# Patient Record
Sex: Male | Born: 1965 | Race: White | Hispanic: No | Marital: Married | Smoking: Never smoker
Health system: Southern US, Community
[De-identification: ages and names within clinical notes are randomized; demographics above are authoritative.]

## PROBLEM LIST (undated history)

## (undated) DIAGNOSIS — T7840XA Allergy, unspecified, initial encounter: Secondary | ICD-10-CM

## (undated) DIAGNOSIS — K4090 Unilateral inguinal hernia, without obstruction or gangrene, not specified as recurrent: Secondary | ICD-10-CM

## (undated) DIAGNOSIS — E785 Hyperlipidemia, unspecified: Secondary | ICD-10-CM

## (undated) DIAGNOSIS — I10 Essential (primary) hypertension: Secondary | ICD-10-CM

## (undated) DIAGNOSIS — J45909 Unspecified asthma, uncomplicated: Secondary | ICD-10-CM

## (undated) HISTORY — DX: Unspecified asthma, uncomplicated: J45.909

## (undated) HISTORY — PX: HERNIA REPAIR: SHX51

## (undated) HISTORY — DX: Hyperlipidemia, unspecified: E78.5

## (undated) HISTORY — DX: Unilateral inguinal hernia, without obstruction or gangrene, not specified as recurrent: K40.90

## (undated) HISTORY — DX: Allergy, unspecified, initial encounter: T78.40XA

---

## 2017-06-06 LAB — HM COLONOSCOPY

## 2017-07-25 LAB — HM COLONOSCOPY

## 2020-03-02 ENCOUNTER — Ambulatory Visit: Payer: Self-pay | Attending: Internal Medicine

## 2020-03-02 DIAGNOSIS — Z23 Encounter for immunization: Secondary | ICD-10-CM

## 2020-03-02 NOTE — Progress Notes (Signed)
   Covid-19 Vaccination Clinic  Name:  Uvaldo Rybacki    MRN: [id number] DOB: 1966-09-30  03/02/2020  Mr. Lorenson was observed post Covid-19 immunization for 15 minutes without incident. He was provided with Vaccine Information Sheet and instruction to access the V-Safe system.   Mr. Roig was instructed to call 911 with any severe reactions post vaccine: Marland Kitchen Difficulty breathing  . Swelling of face and throat  . A fast heartbeat  . A bad rash all over body  . Dizziness and weakness   Immunizations Administered    Name Date Dose VIS Date Route   Pfizer COVID-19 Vaccine 03/02/2020  3:11 PM 0.3 mL 11/15/2019 Intramuscular   Manufacturer: [employer], [employer]   Lot: [id number]   NDC: [address]-5

## 2020-03-25 ENCOUNTER — Ambulatory Visit: Payer: Self-pay | Attending: Internal Medicine

## 2020-03-25 DIAGNOSIS — Z23 Encounter for immunization: Secondary | ICD-10-CM

## 2020-03-25 NOTE — Progress Notes (Signed)
   Covid-19 Vaccination Clinic  Name:  James Jimenez    MRN: [id number] DOB: 07/14/1966  03/25/2020  [name] was observed post Covid-19 immunization for 15 minutes without incident. He was provided with Vaccine Information Sheet and instruction to access the V-Safe system.   [name] was instructed to call 911 with any severe reactions post vaccine: Marland Kitchen Difficulty breathing  . Swelling of face and throat  . A fast heartbeat  . A bad rash all over body  . Dizziness and weakness   Immunizations Administered    Name Date Dose VIS Date Route   Pfizer COVID-19 Vaccine 03/25/2020 10:22 AM 0.3 mL 01/29/2019 Intramuscular   Manufacturer: [employer], [employer]   Lot: [id number]   NDC: [address]-7

## 2020-09-14 ENCOUNTER — Other Ambulatory Visit: Payer: Self-pay

## 2020-09-14 ENCOUNTER — Encounter: Payer: Self-pay | Admitting: Family Medicine

## 2020-09-14 ENCOUNTER — Ambulatory Visit (INDEPENDENT_AMBULATORY_CARE_PROVIDER_SITE_OTHER): Payer: 59 | Admitting: Family Medicine

## 2020-09-14 VITALS — BP 135/90 | HR 64 | Temp 98.2°F | Ht 73.0 in | Wt 239.0 lb

## 2020-09-14 DIAGNOSIS — I10 Essential (primary) hypertension: Secondary | ICD-10-CM

## 2020-09-14 DIAGNOSIS — Z0001 Encounter for general adult medical examination with abnormal findings: Secondary | ICD-10-CM

## 2020-09-14 DIAGNOSIS — K8050 Calculus of bile duct without cholangitis or cholecystitis without obstruction: Secondary | ICD-10-CM | POA: Diagnosis not present

## 2020-09-14 DIAGNOSIS — Z1211 Encounter for screening for malignant neoplasm of colon: Secondary | ICD-10-CM

## 2020-09-14 DIAGNOSIS — J45909 Unspecified asthma, uncomplicated: Secondary | ICD-10-CM | POA: Diagnosis not present

## 2020-09-14 DIAGNOSIS — Z13220 Encounter for screening for lipoid disorders: Secondary | ICD-10-CM

## 2020-09-14 DIAGNOSIS — Z125 Encounter for screening for malignant neoplasm of prostate: Secondary | ICD-10-CM

## 2020-09-14 LAB — COMPREHENSIVE METABOLIC PANEL
AG Ratio: 1.9 (calc) (ref 1.0–2.5)
ALT: 16 U/L (ref 9–46)
AST: 16 U/L (ref 10–35)
Albumin: 4.3 g/dL (ref 3.6–5.1)
Alkaline phosphatase (APISO): 67 U/L (ref 35–144)
BUN/Creatinine Ratio: 14 (calc) (ref 6–22)
BUN: 20 mg/dL (ref 7–25)
CO2: 31 mmol/L (ref 20–32)
Calcium: 9.5 mg/dL (ref 8.6–10.3)
Chloride: 106 mmol/L (ref 98–110)
Creat: 1.39 mg/dL — ABNORMAL HIGH (ref 0.70–1.33)
Globulin: 2.3 g/dL (calc) (ref 1.9–3.7)
Glucose, Bld: 78 mg/dL (ref 65–99)
Potassium: 4.5 mmol/L (ref 3.5–5.3)
Sodium: 142 mmol/L (ref 135–146)
Total Bilirubin: 0.5 mg/dL (ref 0.2–1.2)
Total Protein: 6.6 g/dL (ref 6.1–8.1)

## 2020-09-14 LAB — TSH: TSH: 1.31 mIU/L (ref 0.40–4.50)

## 2020-09-14 LAB — LIPID PANEL
Cholesterol: 169 mg/dL (ref <200)
HDL: 33 mg/dL — ABNORMAL LOW (ref >=40)
LDL Cholesterol (Calc): 100 mg/dL (calc) — ABNORMAL HIGH
Non-HDL Cholesterol (Calc): 136 mg/dL (calc) — ABNORMAL HIGH (ref <130)
Total CHOL/HDL Ratio: 5.1 (calc) — ABNORMAL HIGH (ref <5.0)
Triglycerides: 249 mg/dL — ABNORMAL HIGH (ref <150)

## 2020-09-14 LAB — CBC
HCT: 43.6 % (ref 38.5–50.0)
Hemoglobin: 14.6 g/dL (ref 13.2–17.1)
MCH: 30 pg (ref 27.0–33.0)
MCHC: 33.5 g/dL (ref 32.0–36.0)
MCV: 89.7 fL (ref 80.0–100.0)
MPV: 10.3 fL (ref 7.5–12.5)
Platelets: 214 10*3/uL (ref 140–400)
RBC: 4.86 10*6/uL (ref 4.20–5.80)
RDW: 12.6 % (ref 11.0–15.0)
WBC: 6.7 10*3/uL (ref 3.8–10.8)

## 2020-09-14 LAB — PSA: PSA: 2.88 ng/mL (ref <=4.0)

## 2020-09-14 MED ORDER — ALBUTEROL SULFATE HFA 108 (90 BASE) MCG/ACT IN AERS: 2.0000 | INHALATION_SPRAY | Freq: Four times a day (QID) | RESPIRATORY_TRACT | 0 refills | Status: DC | PRN

## 2020-09-14 MED ORDER — AMLODIPINE BESYLATE 5 MG PO TABS: 5.0000 mg | ORAL_TABLET | Freq: Every day | ORAL | 3 refills | Status: DC

## 2020-09-14 NOTE — Assessment & Plan Note (Signed)
Stable.  Albuterol refilled. ?

## 2020-09-14 NOTE — Patient Instructions (Signed)
It was very nice to see you today!  We will check blood work today.  I will place an order for you to get an ultrasound of your gallbladder.  We may need to refer you to see a surgeon pending on the results.  I will refill your amlodipine and albuterol.  I will see you back in a year.  Please come back to see me sooner if needed.  Take care, Dr Jerline Pain  Please try these tips to maintain a healthy lifestyle:   Eat at least 3 REAL meals and 1-2 snacks per day.  Aim for no more than 5 hours between eating.  If you eat breakfast, please do so within one hour of getting up.    Each meal should contain half fruits/vegetables, one quarter protein, and one quarter carbs (no bigger than a computer mouse)   Cut down on sweet beverages. This includes juice, soda, and sweet tea.     Drink at least 1 glass of water with each meal and aim for at least 8 glasses per day   Exercise at least 150 minutes every week.    Preventive Care 18-84 Years Old, Male Preventive care refers to lifestyle choices and visits with your health care provider that can promote health and wellness. This includes:  A yearly physical exam. This is also called an annual well check.  Regular dental and eye exams.  Immunizations.  Screening for certain conditions.  Healthy lifestyle choices, such as eating a healthy diet, getting regular exercise, not using drugs or products that contain nicotine and tobacco, and limiting alcohol use. What can I expect for my preventive care visit? Physical exam Your health care provider will check:  Height and weight. These may be used to calculate body mass index (BMI), which is a measurement that tells if you are at a healthy weight.  Heart rate and blood pressure.  Your skin for abnormal spots. Counseling Your health care provider may ask you questions about:  Alcohol, tobacco, and drug use.  Emotional well-being.  Home and relationship well-being.  Sexual  activity.  Eating habits.  Work and work Statistician. What immunizations do I need?  Influenza (flu) vaccine  This is recommended every year. Tetanus, diphtheria, and pertussis (Tdap) vaccine  You may need a Td booster every 10 years. Varicella (chickenpox) vaccine  You may need this vaccine if you have not already been vaccinated. Zoster (shingles) vaccine  You may need this after age 53. Measles, mumps, and rubella (MMR) vaccine  You may need at least one dose of MMR if you were born in 1957 or later. You may also need a second dose. Pneumococcal conjugate (PCV13) vaccine  You may need this if you have certain conditions and were not previously vaccinated. Pneumococcal polysaccharide (PPSV23) vaccine  You may need one or two doses if you smoke cigarettes or if you have certain conditions. Meningococcal conjugate (MenACWY) vaccine  You may need this if you have certain conditions. Hepatitis A vaccine  You may need this if you have certain conditions or if you travel or work in places where you may be exposed to hepatitis A. Hepatitis B vaccine  You may need this if you have certain conditions or if you travel or work in places where you may be exposed to hepatitis B. Haemophilus influenzae type b (Hib) vaccine  You may need this if you have certain risk factors. Human papillomavirus (HPV) vaccine  If recommended by your health care provider, you may  need three doses over 6 months. You may receive vaccines as individual doses or as more than one vaccine together in one shot (combination vaccines). Talk with your health care provider about the risks and benefits of combination vaccines. What tests do I need? Blood tests  Lipid and cholesterol levels. These may be checked every 5 years, or more frequently if you are over 52 years old.  Hepatitis C test.  Hepatitis B test. Screening  Lung cancer screening. You may have this screening every year starting at age 44 if  you have a 30-pack-year history of smoking and currently smoke or have quit within the past 15 years.  Prostate cancer screening. Recommendations will vary depending on your family history and other risks.  Colorectal cancer screening. All adults should have this screening starting at age 51 and continuing until age 92. Your health care provider may recommend screening at age 12 if you are at increased risk. You will have tests every 1-10 years, depending on your results and the type of screening test.  Diabetes screening. This is done by checking your blood sugar (glucose) after you have not eaten for a while (fasting). You may have this done every 1-3 years.  Sexually transmitted disease (STD) testing. Follow these instructions at home: Eating and drinking  Eat a diet that includes fresh fruits and vegetables, whole grains, lean protein, and low-fat dairy products.  Take vitamin and mineral supplements as recommended by your health care provider.  Do not drink alcohol if your health care provider tells you not to drink.  If you drink alcohol: ? Limit how much you have to 0-2 drinks a day. ? Be aware of how much alcohol is in your drink. In the [place] one drink equals one 12 oz bottle of beer (355 mL), one 5 oz glass of wine (148 mL), or one 1 oz glass of hard liquor (44 mL). Lifestyle  Take daily care of your teeth and gums.  Stay active. Exercise for at least 30 minutes on 5 or more days each week.  Do not use any products that contain nicotine or tobacco, such as cigarettes, e-cigarettes, and chewing tobacco. If you need help quitting, ask your health care provider.  If you are sexually active, practice safe sex. Use a condom or other form of protection to prevent STIs (sexually transmitted infections).  Talk with your health care provider about taking a low-dose aspirin every day starting at age 73. What's next?  Go to your health care provider once a year for a well check  visit.  Ask your health care provider how often you should have your eyes and teeth checked.  Stay up to date on all vaccines. This information is not intended to replace advice given to you by your health care provider. Make sure you discuss any questions you have with your health care provider. Document Revised: 11/15/2018 Document Reviewed: 11/15/2018 Elsevier Patient Education  2020 [employer].

## 2020-09-14 NOTE — Progress Notes (Addendum)
Chief Complaint:  James Jimenez is a 54 y.o. male who presents today for his annual comprehensive physical exam.    Assessment/Plan:  New/Acute Problems: Biliary colic We will check right upper quadrant ultrasound. May need surgical referral depending on results.   Chronic Problems Addressed Today: Reactive airway disease without complication Stable.  Albuterol refilled.  Essential hypertension At goal.  Refill of amlodipine 5 mg daily.  Preventative Healthcare: Referral placed for colonoscopy.  Check CBC, CMET, TSH, and lipid panel.  Check PSA.  Has received Covid vaccine.  Patient Counseling(The following topics were reviewed and/or handout was given):  -Nutrition: Stressed importance of moderation in sodium/caffeine intake, saturated fat and cholesterol, caloric balance, sufficient intake of fresh fruits, vegetables, and fiber.  -Stressed the importance of regular exercise.   -Substance Abuse: Discussed cessation/primary prevention of tobacco, alcohol, or other drug use; driving or other dangerous activities under the influence; availability of treatment for abuse.   -Injury prevention: Discussed safety belts, safety helmets, smoke detector, smoking near bedding or upholstery.   -Sexuality: Discussed sexually transmitted diseases, partner selection, use of condoms, avoidance of unintended pregnancy and contraceptive alternatives.   -Dental health: Discussed importance of regular tooth brushing, flossing, and dental visits.  -Health maintenance and immunizations reviewed. Please refer to Health maintenance section.  Return to care in 1 year for next preventative visit.     Subjective:  HPI:  He has no acute complaints today.   He would like a refill on amlodipine and albuterol.  He has been taking amlodipine 5 mg daily and doing well.  He uses albuterol very frequently as needed.  Is also concerned about gallstones.  Has had 2 episodes of right upper quadrant and right  lower back pain after eating something especially fatty such as ice cream.  Some associated nausea as well.  Has stable history of gallstones.  Lifestyle Diet: Balanced. Plenty of fruits and vegetables.  Exercise: Busy with work.   No flowsheet data found.  Health Maintenance Due  Topic Date Due  . Hepatitis C Screening  Never done  . HIV Screening  Never done  . TETANUS/TDAP  Never done  . COLONOSCOPY  12/06/2019     ROS: Per HPI, otherwise a complete review of systems was negative.   PMH:  The following were reviewed and entered/updated in epic: Past Medical History:  Diagnosis Date  . Hyperlipidemia    Patient Active Problem List   Diagnosis Date Noted  . Essential hypertension 09/14/2020  . Reactive airway disease without complication 09/14/2020   Past Surgical History:  Procedure Laterality Date  . HERNIA REPAIR  1995 1991    Family History  Problem Relation Age of Onset  . Hypertension Mother   . Arthritis Mother   . Cancer Mother   . Hyperlipidemia Father   . Cancer Father   . Hypertension Sister   . Gallbladder disease Sister   . Gallbladder disease Sister   . Arthritis Maternal Grandmother   . Hypertension Maternal Grandmother   . Kidney disease Maternal Grandmother   . Cancer Maternal Grandfather   . Epilepsy Maternal Grandfather   . Neuropathy Paternal Grandfather     Medications- reviewed and updated Current Outpatient Medications  Medication Sig Dispense Refill  . albuterol (VENTOLIN HFA) 108 (90 Base) MCG/ACT inhaler Inhale 2 puffs into the lungs every 6 (six) hours as needed for wheezing or shortness of breath. 8 g 0  . amLODipine (NORVASC) 5 MG tablet Take 1 tablet (5 mg total)  by mouth daily. 90 tablet 3   No current facility-administered medications for this visit.    Allergies-reviewed and updated No Known Allergies  Social History   Socioeconomic History  . Marital status: Married    Spouse name: Not on file  . Number of  children: Not on file  . Years of education: Not on file  . Highest education level: Not on file  Occupational History  . Not on file  Tobacco Use  . Smoking status: Not on file  Substance and Sexual Activity  . Alcohol use: Never  . Drug use: Never  . Sexual activity: Not on file  Other Topics Concern  . Not on file  Social History Narrative  . Not on file   Social Determinants of Health   Financial Resource Strain:   . Difficulty of Paying Living Expenses: Not on file  Food Insecurity:   . Worried About Programme researcher, broadcasting/film/video in the Last Year: Not on file  . Ran Out of Food in the Last Year: Not on file  Transportation Needs:   . Lack of Transportation (Medical): Not on file  . Lack of Transportation (Non-Medical): Not on file  Physical Activity:   . Days of Exercise per Week: Not on file  . Minutes of Exercise per Session: Not on file  Stress:   . Feeling of Stress : Not on file  Social Connections:   . Frequency of Communication with Friends and Family: Not on file  . Frequency of Social Gatherings with Friends and Family: Not on file  . Attends Religious Services: Not on file  . Active Member of Clubs or Organizations: Not on file  . Attends Banker Meetings: Not on file  . Marital Status: Not on file        Objective:  Physical Exam: BP 135/90   Pulse 64   Temp 98.2 F (36.8 C) (Temporal)   Ht 6\' 1"  (1.854 m)   Wt 239 lb (108.4 kg)   SpO2 97%   BMI 31.53 kg/m   Body mass index is 31.53 kg/m. Wt Readings from Last 3 Encounters:  09/14/20 239 lb (108.4 kg)   Gen: NAD, resting comfortably HEENT: TMs normal bilaterally. OP clear. No thyromegaly noted.  CV: RRR with no murmurs appreciated Pulm: NWOB, CTAB with no crackles, wheezes, or rhonchi GI: Normal bowel sounds present. Soft, Nontender, Nondistended. MSK: no edema, cyanosis, or clubbing noted Skin: warm, dry Neuro: CN2-12 grossly intact. Strength 5/5 in upper and lower extremities.  Reflexes symmetric and intact bilaterally.  Psych: Normal affect and thought content     Peter Daquila M. 11/14/20, MD 09/14/2020 2:42 PM

## 2020-09-14 NOTE — Assessment & Plan Note (Signed)
At goal.  Refill of amlodipine 5 mg daily.

## 2020-09-16 NOTE — Progress Notes (Signed)
Please inform patient of the following:  Cholesterol is borderline. Everything else is stable. We can recheck in a year. Will contact him with results of ultrasound once we receive them.  James Jimenez. [name], MD 09/16/2020 9:40 AM

## 2020-09-23 ENCOUNTER — Ambulatory Visit
Admission: RE | Admit: 2020-09-23 | Discharge: 2020-09-23 | Disposition: A | Discharge status: Home / Self Care | Payer: 59 | Source: Ambulatory Visit | Attending: Family Medicine | Admitting: Family Medicine

## 2020-09-23 DIAGNOSIS — K8050 Calculus of bile duct without cholangitis or cholecystitis without obstruction: Secondary | ICD-10-CM

## 2020-09-24 ENCOUNTER — Other Ambulatory Visit: Payer: Self-pay

## 2020-09-24 DIAGNOSIS — K8050 Calculus of bile duct without cholangitis or cholecystitis without obstruction: Secondary | ICD-10-CM

## 2020-09-24 DIAGNOSIS — K8020 Calculus of gallbladder without cholecystitis without obstruction: Secondary | ICD-10-CM

## 2020-09-24 NOTE — Progress Notes (Signed)
Please inform patient of the following:  Ultrasound confirms gallstones. Recommend surgical referral to discuss next steps.  [name]. [name], MD 09/24/2020 10:57 AM

## 2020-10-15 ENCOUNTER — Encounter: Payer: Self-pay | Admitting: *Deleted

## 2020-11-04 ENCOUNTER — Ambulatory Visit: Payer: Self-pay | Admitting: Surgery

## 2020-11-23 ENCOUNTER — Other Ambulatory Visit (HOSPITAL_COMMUNITY): Payer: 59

## 2020-11-23 ENCOUNTER — Encounter (HOSPITAL_COMMUNITY): Admission: RE | Admit: 2020-11-23 | Payer: 59 | Source: Ambulatory Visit

## 2020-12-03 NOTE — Progress Notes (Signed)
DUE TO COVID-19 ONLY ONE VISITOR IS ALLOWED TO COME WITH YOU AND STAY IN THE WAITING ROOM ONLY DURING PRE OP AND PROCEDURE DAY OF SURGERY. THE 1 VISITOR  MAY VISIT WITH YOU AFTER SURGERY IN YOUR PRIVATE ROOM DURING VISITING HOURS ONLY!  YOU NEED TO HAVE A COVID 19 TEST ON__1/02/2021 _____ @_0930am  ______, THIS TEST MUST BE DONE BEFORE SURGERY,  COVID TESTING SITE [address] [place] , IT IS ON THE RIGHT GOING OUT [address] APPROXIMATELY  2 MINUTES PAST [employer] ON THE RIGHT. ONCE YOUR COVID TEST IS COMPLETED,  PLEASE BEGIN THE QUARANTINE INSTRUCTIONS AS OUTLINED IN YOUR HANDOUT.                James Jimenez  12/03/2020   Your procedure is scheduled on: 12/09/2020    Report to [facility] Main  Entrance   Report to admitting at    0900 AM     Call this number if you have problems the morning of surgery [phone]    REMEMBER: NO  SOLID FOOD CANDY OR GUM AFTER MIDNIGHT. CLEAR LIQUIDS UNTIL 0800am         . NOTHING BY MOUTH EXCEPT CLEAR LIQUIDS UNTIL    . PLEASE FINISH ENSURE DRINK PER SURGEON ORDER  WHICH NEEDS TO BE COMPLETED AT    0800am   .      CLEAR LIQUID DIET   Foods Allowed                                                                    Coffee and tea, regular and decaf                            Fruit ices (not with fruit pulp)                                      Iced Popsicles                                    Carbonated beverages, regular and diet                                    Cranberry, grape and apple juices Sports drinks like Gatorade Lightly seasoned clear broth or consume(fat free) Sugar, honey syrup ___________________________________________________________________      BRUSH YOUR TEETH MORNING OF SURGERY AND RINSE YOUR MOUTH OUT, NO CHEWING GUM CANDY OR MINTS.     Take these medicines the morning of surgery with A SIP OF WATER: amlodipine, inhalers as usual and bring,   DO NOT TAKE ANY DIABETIC  MEDICATIONS DAY OF YOUR SURGERY                               You may not have any metal on your body including hair pins and  piercings  Do not wear jewelry, make-up, lotions, powders or perfumes, deodorant             Do not wear nail polish on your fingernails.  Do not shave  48 hours prior to surgery.              Men may shave face and neck.   Do not bring valuables to the hospital. [place].  Contacts, dentures or bridgework may not be worn into surgery.  Leave suitcase in the car. After surgery it may be brought to your room.     Patients discharged the day of surgery will not be allowed to drive home. IF YOU ARE HAVING SURGERY AND GOING HOME THE SAME DAY, YOU MUST HAVE AN ADULT TO DRIVE YOU HOME AND BE WITH YOU FOR 24 HOURS. YOU MAY GO HOME BY TAXI OR UBER OR ORTHERWISE, BUT AN ADULT MUST ACCOMPANY YOU HOME AND STAY WITH YOU FOR 24 HOURS.  Name and phone number of your driver:  Special Instructions: N/A              Please read over the following fact sheets you were given: _____________________________________________________________________  [facility] - Preparing for Surgery Before surgery, you can play an important role.  Because skin is not sterile, your skin needs to be as free of germs as possible.  You can reduce the number of germs on your skin by washing with CHG (chlorahexidine gluconate) soap before surgery.  CHG is an antiseptic cleaner which kills germs and bonds with the skin to continue killing germs even after washing. Please DO NOT use if you have an allergy to CHG or antibacterial soaps.  If your skin becomes reddened/irritated stop using the CHG and inform your nurse when you arrive at Short Stay. Do not shave (including legs and underarms) for at least 48 hours prior to the first CHG shower.  You may shave your face/neck. Please follow these instructions carefully:  1.  Shower with CHG Soap the night  before surgery and the  morning of Surgery.  2.  If you choose to wash your hair, wash your hair first as usual with your  normal  shampoo.  3.  After you shampoo, rinse your hair and body thoroughly to remove the  shampoo.                           4.  Use CHG as you would any other liquid soap.  You can apply chg directly  to the skin and wash                       Gently with a scrungie or clean washcloth.  5.  Apply the CHG Soap to your body ONLY FROM THE NECK DOWN.   Do not use on face/ open                           Wound or open sores. Avoid contact with eyes, ears mouth and genitals (private parts).                       Wash face,  Genitals (private parts) with your normal soap.             6.  Wash thoroughly, paying special attention to the area where your surgery  will be performed.  7.  Thoroughly rinse your body with warm water from the neck down.  8.  DO NOT shower/wash with your normal soap after using and rinsing off  the CHG Soap.                9.  Pat yourself dry with a clean towel.            10.  Wear clean pajamas.            11.  Place clean sheets on your bed the night of your first shower and do not  sleep with pets. Day of Surgery : Do not apply any lotions/deodorants the morning of surgery.  Please wear clean clothes to the hospital/surgery center.  FAILURE TO FOLLOW THESE INSTRUCTIONS MAY RESULT IN THE CANCELLATION OF YOUR SURGERY PATIENT SIGNATURE_________________________________  NURSE SIGNATURE__________________________________  ________________________________________________________________________

## 2020-12-07 ENCOUNTER — Other Ambulatory Visit (HOSPITAL_COMMUNITY)
Admission: RE | Admit: 2020-12-07 | Discharge: 2020-12-07 | Disposition: A | Discharge status: Home / Self Care | Payer: 59 | Source: Ambulatory Visit | Attending: Surgery | Admitting: Surgery

## 2020-12-07 ENCOUNTER — Other Ambulatory Visit: Payer: Self-pay

## 2020-12-07 ENCOUNTER — Encounter (HOSPITAL_COMMUNITY): Payer: Self-pay

## 2020-12-07 ENCOUNTER — Encounter (HOSPITAL_COMMUNITY)
Admission: RE | Admit: 2020-12-07 | Discharge: 2020-12-07 | Disposition: A | Discharge status: Home / Self Care | Payer: 59 | Source: Ambulatory Visit | Attending: Surgery | Admitting: Surgery

## 2020-12-07 DIAGNOSIS — Z01812 Encounter for preprocedural laboratory examination: Secondary | ICD-10-CM | POA: Insufficient documentation

## 2020-12-07 DIAGNOSIS — Z20822 Contact with and (suspected) exposure to covid-19: Secondary | ICD-10-CM | POA: Diagnosis not present

## 2020-12-07 DIAGNOSIS — Z01818 Encounter for other preprocedural examination: Secondary | ICD-10-CM | POA: Insufficient documentation

## 2020-12-07 HISTORY — DX: Essential (primary) hypertension: I10

## 2020-12-07 LAB — BASIC METABOLIC PANEL
Anion gap: 8 (ref 5–15)
BUN: 18 mg/dL (ref 6–20)
CO2: 26 mmol/L (ref 22–32)
Calcium: 9.2 mg/dL (ref 8.9–10.3)
Chloride: 107 mmol/L (ref 98–111)
Creatinine, Ser: 1.15 mg/dL (ref 0.61–1.24)
GFR, Estimated: >60 mL/min (ref >60)
Glucose, Bld: 118 mg/dL — ABNORMAL HIGH (ref 70–99)
Potassium: 3.7 mmol/L (ref 3.5–5.1)
Sodium: 141 mmol/L (ref 135–145)

## 2020-12-07 LAB — CBC
HCT: 45.6 % (ref 39.0–52.0)
Hemoglobin: 15.2 g/dL (ref 13.0–17.0)
MCH: 30.2 pg (ref 26.0–34.0)
MCHC: 33.3 g/dL (ref 30.0–36.0)
MCV: 90.5 fL (ref 80.0–100.0)
Platelets: 209 10*3/uL (ref 150–400)
RBC: 5.04 MIL/uL (ref 4.22–5.81)
RDW: 12.6 % (ref 11.5–15.5)
WBC: 5.4 10*3/uL (ref 4.0–10.5)
nRBC: 0 % (ref 0.0–0.2)

## 2020-12-08 LAB — SARS CORONAVIRUS 2 (TAT 6-24 HRS): SARS Coronavirus 2: NEGATIVE

## 2020-12-09 ENCOUNTER — Ambulatory Visit (HOSPITAL_COMMUNITY)
Admission: RE | Admit: 2020-12-09 | Discharge: 2020-12-09 | Disposition: A | Discharge status: Home / Self Care | Payer: 59 | Attending: Surgery | Admitting: Surgery

## 2020-12-09 ENCOUNTER — Encounter (HOSPITAL_COMMUNITY): Payer: Self-pay | Admitting: Surgery

## 2020-12-09 ENCOUNTER — Ambulatory Visit: Payer: Self-pay | Admitting: Surgery

## 2020-12-09 ENCOUNTER — Ambulatory Visit (HOSPITAL_COMMUNITY): Payer: 59 | Admitting: Certified Registered Nurse Anesthetist

## 2020-12-09 ENCOUNTER — Encounter (HOSPITAL_COMMUNITY)
Admission: RE | Disposition: A | Discharge status: Home / Self Care | Payer: Self-pay | Source: Home / Self Care | Attending: Surgery

## 2020-12-09 DIAGNOSIS — K8000 Calculus of gallbladder with acute cholecystitis without obstruction: Secondary | ICD-10-CM | POA: Insufficient documentation

## 2020-12-09 DIAGNOSIS — Z79899 Other long term (current) drug therapy: Secondary | ICD-10-CM | POA: Insufficient documentation

## 2020-12-09 DIAGNOSIS — K8050 Calculus of bile duct without cholangitis or cholecystitis without obstruction: Secondary | ICD-10-CM | POA: Diagnosis present

## 2020-12-09 HISTORY — PX: CHOLECYSTECTOMY: SHX55

## 2020-12-09 SURGERY — LAPAROSCOPIC CHOLECYSTECTOMY
Anesthesia: General | Site: Abdomen

## 2020-12-09 MED ORDER — ROCURONIUM BROMIDE 10 MG/ML (PF) SYRINGE
PREFILLED_SYRINGE | INTRAVENOUS | Status: DC | PRN
  Administered 2020-12-09: 60 mg via INTRAVENOUS

## 2020-12-09 MED ORDER — DEXAMETHASONE SODIUM PHOSPHATE 10 MG/ML IJ SOLN
INTRAMUSCULAR | Status: AC
  Filled 2020-12-09: qty 1

## 2020-12-09 MED ORDER — MIDAZOLAM HCL 5 MG/5ML IJ SOLN
INTRAMUSCULAR | Status: DC | PRN
  Administered 2020-12-09: 2 mg via INTRAVENOUS

## 2020-12-09 MED ORDER — ROCURONIUM BROMIDE 10 MG/ML (PF) SYRINGE
PREFILLED_SYRINGE | INTRAVENOUS | Status: AC
  Filled 2020-12-09: qty 10

## 2020-12-09 MED ORDER — PROPOFOL 10 MG/ML IV BOLUS
INTRAVENOUS | Status: AC
  Filled 2020-12-09: qty 20

## 2020-12-09 MED ORDER — CHLORHEXIDINE GLUCONATE CLOTH 2 % EX PADS: 6.0000 | MEDICATED_PAD | Freq: Once | CUTANEOUS | Status: DC

## 2020-12-09 MED ORDER — ACETAMINOPHEN 500 MG PO TABS
1000.0000 mg | ORAL_TABLET | ORAL | Status: AC
  Administered 2020-12-09: 1000 mg via ORAL
  Filled 2020-12-09: qty 2

## 2020-12-09 MED ORDER — MIDAZOLAM HCL 2 MG/2ML IJ SOLN
INTRAMUSCULAR | Status: AC
  Filled 2020-12-09: qty 2

## 2020-12-09 MED ORDER — FENTANYL CITRATE (PF) 250 MCG/5ML IJ SOLN
INTRAMUSCULAR | Status: AC
  Filled 2020-12-09: qty 5

## 2020-12-09 MED ORDER — EPHEDRINE SULFATE 50 MG/ML IJ SOLN
INTRAMUSCULAR | Status: DC | PRN
  Administered 2020-12-09 (×2): 5 mg via INTRAVENOUS

## 2020-12-09 MED ORDER — ORAL CARE MOUTH RINSE: 15.0000 mL | Freq: Once | OROMUCOSAL | Status: AC

## 2020-12-09 MED ORDER — OXYCODONE HCL 5 MG PO TABS
ORAL_TABLET | ORAL | Status: AC
  Filled 2020-12-09: qty 1

## 2020-12-09 MED ORDER — PHENYLEPHRINE 40 MCG/ML (10ML) SYRINGE FOR IV PUSH (FOR BLOOD PRESSURE SUPPORT)
PREFILLED_SYRINGE | INTRAVENOUS | Status: AC
  Filled 2020-12-09: qty 10

## 2020-12-09 MED ORDER — SUGAMMADEX SODIUM 200 MG/2ML IV SOLN
INTRAVENOUS | Status: DC | PRN
  Administered 2020-12-09: 200 mg via INTRAVENOUS

## 2020-12-09 MED ORDER — ONDANSETRON HCL 4 MG/2ML IJ SOLN
INTRAMUSCULAR | Status: DC | PRN
  Administered 2020-12-09: 4 mg via INTRAVENOUS

## 2020-12-09 MED ORDER — FENTANYL CITRATE (PF) 250 MCG/5ML IJ SOLN
INTRAMUSCULAR | Status: DC | PRN
  Administered 2020-12-09 (×2): 50 ug via INTRAVENOUS
  Administered 2020-12-09: 100 ug via INTRAVENOUS

## 2020-12-09 MED ORDER — OXYCODONE HCL 5 MG/5ML PO SOLN: 5.0000 mg | Freq: Once | ORAL | Status: AC | PRN

## 2020-12-09 MED ORDER — GLYCOPYRROLATE PF 0.2 MG/ML IJ SOSY
PREFILLED_SYRINGE | INTRAMUSCULAR | Status: DC | PRN
  Administered 2020-12-09: .2 mg via INTRAVENOUS

## 2020-12-09 MED ORDER — CEFAZOLIN SODIUM-DEXTROSE 2-4 GM/100ML-% IV SOLN
2.0000 g | INTRAVENOUS | Status: AC
  Administered 2020-12-09: 2 g via INTRAVENOUS
  Filled 2020-12-09: qty 100

## 2020-12-09 MED ORDER — DEXAMETHASONE SODIUM PHOSPHATE 10 MG/ML IJ SOLN
INTRAMUSCULAR | Status: DC | PRN
  Administered 2020-12-09: 10 mg via INTRAVENOUS

## 2020-12-09 MED ORDER — LACTATED RINGERS IR SOLN
Status: DC | PRN
  Administered 2020-12-09: 1000 mL

## 2020-12-09 MED ORDER — ACETAMINOPHEN 500 MG PO TABS
500.0000 mg | ORAL_TABLET | Freq: Four times a day (QID) | ORAL | 0 refills | Status: DC | PRN
Start: 2020-12-09 — End: 2020-12-19

## 2020-12-09 MED ORDER — HYDROMORPHONE HCL 1 MG/ML IJ SOLN
INTRAMUSCULAR | Status: AC
  Filled 2020-12-09: qty 1

## 2020-12-09 MED ORDER — BUPIVACAINE-EPINEPHRINE (PF) 0.25% -1:200000 IJ SOLN
INTRAMUSCULAR | Status: AC
  Filled 2020-12-09: qty 30

## 2020-12-09 MED ORDER — PROPOFOL 10 MG/ML IV BOLUS
INTRAVENOUS | Status: DC | PRN
  Administered 2020-12-09: 150 mg via INTRAVENOUS
  Administered 2020-12-09: 50 mg via INTRAVENOUS

## 2020-12-09 MED ORDER — OXYCODONE-ACETAMINOPHEN 5-325 MG PO TABS: 1.0000 | ORAL_TABLET | ORAL | 0 refills | Status: DC | PRN

## 2020-12-09 MED ORDER — LIDOCAINE HCL (PF) 2 % IJ SOLN
INTRAMUSCULAR | Status: AC
  Filled 2020-12-09: qty 5

## 2020-12-09 MED ORDER — IBUPROFEN 800 MG PO TABS: 800.0000 mg | ORAL_TABLET | Freq: Three times a day (TID) | ORAL | 0 refills | Status: AC

## 2020-12-09 MED ORDER — KETOROLAC TROMETHAMINE 30 MG/ML IJ SOLN
30.0000 mg | Freq: Once | INTRAMUSCULAR | Status: AC | PRN
  Administered 2020-12-09: 30 mg via INTRAVENOUS

## 2020-12-09 MED ORDER — CELECOXIB 200 MG PO CAPS
400.0000 mg | ORAL_CAPSULE | ORAL | Status: AC
  Administered 2020-12-09: 400 mg via ORAL
  Filled 2020-12-09: qty 2

## 2020-12-09 MED ORDER — LACTATED RINGERS IV SOLN: INTRAVENOUS | Status: DC

## 2020-12-09 MED ORDER — LIDOCAINE 2% (20 MG/ML) 5 ML SYRINGE
INTRAMUSCULAR | Status: DC | PRN
  Administered 2020-12-09: 60 mg via INTRAVENOUS

## 2020-12-09 MED ORDER — BUPIVACAINE-EPINEPHRINE 0.25% -1:200000 IJ SOLN
INTRAMUSCULAR | Status: DC | PRN
  Administered 2020-12-09: 30 mL

## 2020-12-09 MED ORDER — HYDROMORPHONE HCL 1 MG/ML IJ SOLN
0.2500 mg | INTRAMUSCULAR | Status: DC | PRN
Start: 2020-12-09 — End: 2020-12-09
  Administered 2020-12-09: 0.25 mg via INTRAVENOUS

## 2020-12-09 MED ORDER — 0.9 % SODIUM CHLORIDE (POUR BTL) OPTIME
TOPICAL | Status: DC | PRN
  Administered 2020-12-09: 1000 mL

## 2020-12-09 MED ORDER — OXYCODONE HCL 5 MG PO TABS
5.0000 mg | ORAL_TABLET | Freq: Once | ORAL | Status: AC | PRN
  Administered 2020-12-09: 5 mg via ORAL

## 2020-12-09 MED ORDER — ONDANSETRON HCL 4 MG/2ML IJ SOLN
INTRAMUSCULAR | Status: AC
  Filled 2020-12-09: qty 2

## 2020-12-09 MED ORDER — GABAPENTIN 300 MG PO CAPS
300.0000 mg | ORAL_CAPSULE | ORAL | Status: AC
  Administered 2020-12-09: 300 mg via ORAL
  Filled 2020-12-09: qty 1

## 2020-12-09 MED ORDER — PROMETHAZINE HCL 25 MG/ML IJ SOLN
6.2500 mg | INTRAMUSCULAR | Status: DC | PRN
Start: 2020-12-09 — End: 2020-12-09

## 2020-12-09 MED ORDER — KETOROLAC TROMETHAMINE 30 MG/ML IJ SOLN
INTRAMUSCULAR | Status: AC
  Filled 2020-12-09: qty 1

## 2020-12-09 MED ORDER — CHLORHEXIDINE GLUCONATE 0.12 % MT SOLN
15.0000 mL | Freq: Once | OROMUCOSAL | Status: AC
  Administered 2020-12-09: 15 mL via OROMUCOSAL

## 2020-12-09 SURGICAL SUPPLY — 34 items
APPLIER CLIP ROT 10 11.4 M/L (STAPLE) ×2
CABLE HIGH FREQUENCY MONO STRZ (ELECTRODE) ×2 IMPLANT
CHLORAPREP W/TINT 26 (MISCELLANEOUS) ×2 IMPLANT
CLIP APPLIE ROT 10 11.4 M/L (STAPLE) ×1 IMPLANT
COVER MAYO STAND STRL (DRAPES) IMPLANT
COVER SURGICAL LIGHT HANDLE (MISCELLANEOUS) ×2 IMPLANT
COVER WAND RF STERILE (DRAPES) IMPLANT
DECANTER SPIKE VIAL GLASS SM (MISCELLANEOUS) ×2 IMPLANT
DERMABOND ADVANCED (GAUZE/BANDAGES/DRESSINGS) ×1
DERMABOND ADVANCED .7 DNX12 (GAUZE/BANDAGES/DRESSINGS) ×1 IMPLANT
DRAPE C-ARM 42X120 X-RAY (DRAPES) IMPLANT
ELECT REM PT RETURN 15FT ADLT (MISCELLANEOUS) ×2 IMPLANT
GLOVE SURG ENC MOIS LTX SZ6 (GLOVE) ×2 IMPLANT
GLOVE SURG UNDER LTX SZ6.5 (GLOVE) ×2 IMPLANT
GOWN STRL REUS W/TWL LRG LVL3 (GOWN DISPOSABLE) ×2 IMPLANT
GOWN STRL REUS W/TWL XL LVL3 (GOWN DISPOSABLE) ×4 IMPLANT
GRASPER SUT TROCAR 14GX15 (MISCELLANEOUS) ×2 IMPLANT
HEMOSTAT SNOW SURGICEL 2X4 (HEMOSTASIS) IMPLANT
KIT BASIN OR (CUSTOM PROCEDURE TRAY) ×2 IMPLANT
KIT TURNOVER KIT A (KITS) IMPLANT
NEEDLE INSUFFLATION 14GA 120MM (NEEDLE) ×2 IMPLANT
PENCIL SMOKE EVACUATOR (MISCELLANEOUS) IMPLANT
POUCH SPECIMEN RETRIEVAL 10MM (ENDOMECHANICALS) ×2 IMPLANT
SCISSORS LAP 5X35 DISP (ENDOMECHANICALS) ×2 IMPLANT
SET CHOLANGIOGRAPH MIX (MISCELLANEOUS) IMPLANT
SET IRRIG TUBING LAPAROSCOPIC (IRRIGATION / IRRIGATOR) ×2 IMPLANT
SET TUBE SMOKE EVAC HIGH FLOW (TUBING) ×2 IMPLANT
SLEEVE XCEL OPT CAN 5 100 (ENDOMECHANICALS) ×4 IMPLANT
SUT MNCRL AB 4-0 PS2 18 (SUTURE) ×2 IMPLANT
TOWEL OR 17X26 10 PK STRL BLUE (TOWEL DISPOSABLE) ×2 IMPLANT
TOWEL OR NON WOVEN STRL DISP B (DISPOSABLE) IMPLANT
TRAY LAPAROSCOPIC (CUSTOM PROCEDURE TRAY) ×2 IMPLANT
TROCAR BLADELESS OPT 5 100 (ENDOMECHANICALS) ×2 IMPLANT
TROCAR XCEL 12X100 BLDLESS (ENDOMECHANICALS) ×2 IMPLANT

## 2020-12-09 NOTE — Anesthesia Procedure Notes (Signed)
Procedure Name: Intubation Date/Time: 12/09/2020 11:11 AM Performed by: Maxwell Caul, CRNA Pre-anesthesia Checklist: Patient identified, Emergency Drugs available, Suction available and Patient being monitored Patient Re-evaluated:Patient Re-evaluated prior to induction Oxygen Delivery Method: Circle system utilized Preoxygenation: Pre-oxygenation with 100% oxygen Induction Type: IV induction Ventilation: Mask ventilation without difficulty Laryngoscope Size: Mac and 4 Grade View: Grade I Tube type: Oral Tube size: 7.5 mm Number of attempts: 1 Airway Equipment and Method: Stylet Placement Confirmation: ETT inserted through vocal cords under direct vision,  positive ETCO2 and breath sounds checked- equal and bilateral Secured at: 21 cm Tube secured with: Tape Dental Injury: Teeth and Oropharynx as per pre-operative assessment

## 2020-12-09 NOTE — Anesthesia Preprocedure Evaluation (Addendum)
Anesthesia Evaluation  Patient identified by MRN, date of birth, ID band Patient awake    Reviewed: Allergy & Precautions, H&P , NPO status , Patient's Chart, lab work & pertinent test results  Airway Mallampati: III  TM Distance: >3 FB Neck ROM: Full    Dental  (+) Dental Advisory Given, Teeth Intact, Chipped,    Pulmonary asthma ,  Uses rescue inhaler about once every 6 weeks   Pulmonary exam normal breath sounds clear to auscultation       Cardiovascular hypertension, Pt. on medications Normal cardiovascular exam Rhythm:Regular Rate:Normal  Poorly controlled HTN   Neuro/Psych negative neurological ROS  negative psych ROS   GI/Hepatic negative GI ROS, Neg liver ROS,   Endo/Other  negative endocrine ROS  Renal/GU negative Renal ROSCr 1.15  negative genitourinary   Musculoskeletal negative musculoskeletal ROS (+)   Abdominal   Peds negative pediatric ROS (+)  Hematology negative hematology ROS (+) hct 45.6, plt 209   Anesthesia Other Findings   Reproductive/Obstetrics negative OB ROS                            Anesthesia Physical Anesthesia Plan  ASA: III  Anesthesia Plan: General   Post-op Pain Management:    Induction: Intravenous  PONV Risk Score and Plan: 2 and Ondansetron, Dexamethasone, Midazolam and Treatment may vary due to age or medical condition  Airway Management Planned: Oral ETT  Additional Equipment: None  Intra-op Plan:   Post-operative Plan: Extubation in OR  Informed Consent: I have reviewed the patients History and Physical, chart, labs and discussed the procedure including the risks, benefits and alternatives for the proposed anesthesia with the patient or authorized representative who has indicated his/her understanding and acceptance.     Dental advisory given  Plan Discussed with: CRNA  Anesthesia Plan Comments:        Anesthesia Quick  Evaluation

## 2020-12-09 NOTE — Anesthesia Postprocedure Evaluation (Signed)
Anesthesia Post Note  Patient: James Jimenez  Procedure(s) Performed: LAPAROSCOPIC CHOLECYSTECTOMY (N/A Abdomen)     Patient location during evaluation: PACU Anesthesia Type: General Level of consciousness: awake and alert, oriented and patient cooperative Pain management: pain level controlled Vital Signs Assessment: post-procedure vital signs reviewed and stable Respiratory status: spontaneous breathing, nonlabored ventilation and respiratory function stable Cardiovascular status: blood pressure returned to baseline and stable Postop Assessment: no apparent nausea or vomiting Anesthetic complications: no   No complications documented.  Last Vitals:  Vitals:   12/09/20 1245 12/09/20 1302  BP: (!) 169/99 (!) 178/99  Pulse: 85 62  Resp: 14   Temp: (!) 36.4 C   SpO2: 96%     Last Pain:  Vitals:   12/09/20 1245  TempSrc:   PainSc: 4                  Lannie Fields

## 2020-12-09 NOTE — Op Note (Signed)
Patient: James Jimenez ([birth year], [id number])  Date of Surgery: 12/09/2020   Preoperative Diagnosis: BILLIARY COLIC   Postoperative Diagnosis: BILLIARY COLIC   Surgical Procedure: LAPAROSCOPIC CHOLECYSTECTOMY:    Operative Team Members:  Surgeon(s) and Role:    * Audryna Wendt, Hyman Hopes, MD - Primary   Anesthesiologist: Lannie Fields, DO CRNA: Orest Dikes, CRNA   Anesthesia: General   Fluids:  Total I/O In: 1100 [I.V.:1000; IV Piggyback:100] Out: -   Complications: * No complications entered in OR log *  Drains:  none   Specimen:  ID Type Source Tests Collected by Time Destination  1 : Gallbladder Tissue PATH Gallbladder SURGICAL PATHOLOGY Dayveon Halley, Hyman Hopes, MD 12/09/2020 1131      Disposition:  PACU - hemodynamically stable.  Plan of Care: Discharge to home after PACU    Indications for Procedure: James Jimenez is a 55 y.o. male who presented with abdominal pain.  History, physical and imaging was concerning for biliary colic.  Laparoscopic cholecystectomy was recommended for the patient.  The procedure itself, as well as the risks, benefits and alternatives were discussed with the patient.  Risks discussed included but were not limited to the risk of infection, bleeding, damage to nearby structures, need to convert to open procedure, incisional hernia, bile leak, common bile duct injury and the need for additional procedures or surgeries.  With this discussion complete and all questions answered the patient granted consent to proceed.  Findings: Gallstones  Description of Procedure:   On the date stated above, the patient was taken to the operating room suite and placed in supine positioning.  Sequential compression devices were placed on the lower extremities to prevent blood clots.  General endotracheal anesthesia was induced. Preoperative antibiotics (cefazolin) were given within 30 minutes of incision.  The patient's abdomen was prepped and draped  in the usual sterile fashion.  A time-out was completed verifying the correct patient, procedure, positioning and equipment needed for the case.  We began by anesthetizing the skin with local anesthetic and then making a 5 mm incision just below the umbilicus.  We dissected through the subcutaneous tissues to the fascia.  The fascia was grasped and elevated using a Kocher clamp.  A Veress needle was inserted into the abdomen and the abdomen was insufflated to 15 mmHg.  A 5 mm trocar was inserted in this position under optical guidance and then the abdomen was inspected.  There was no trauma to the underlying viscera with initial trocar placement.  Any abnormal findings, other than inflammation in the right upper quadrant, are listed above in the findings section.  Three additional trocars were placed, one 12 mm trocar in the subxiphoid position, one 5 mm trocar in the midline epigastric area and one 50mm trocar in the right upper quadrant subcostally.  These were placed under direct vision without any trauma to the underlying viscera.    The patient was then placed in head up, left side down positioning.  The gallbladder was identified and dissected free from its attachments to the omentum allowing the duodenum to fall away.  The infundibulum of the gallbladder was dissected free working laterally to medially.  The cystic duct and cystic artery were dissected free from surrounding connective tissue.  The infundibulum of the gallbladder was dissected off the cystic plate.  A critical view of safety was obtained with the cystic duct and cystic artery being cleared of connective tissues and clearly the only two structures entering into the gallbladder with the  liver clearly visible behind.  Clips were then applied to the cystic duct and cystic artery and then these structures were divided.  The gallbladder was dissected off the cystic plate, placed in an endocatch bag and removed from the 12 mm subxiphoid port site.   The clips were inspected and appeared effective.  The cystic plate was inspected and hemostasis was obtained using electrocautery.  The operative field was dry on final inspection.  Attention was turned to closure.  The 12 mm subxiphoid port site was closed using a 0-vicryl suture on a fascial suture passer.  The abdomen was desufflated.  The skin was closed using 4-0 monocryl and dermabond.  All sponge and needle counts were correct at the conclusion of the case.    Ivar Drape, MD General, Bariatric, & Minimally Invasive Surgery [facility], [place]

## 2020-12-09 NOTE — Transfer of Care (Signed)
Immediate Anesthesia Transfer of Care Note  Patient: James Jimenez  Procedure(s) Performed: LAPAROSCOPIC CHOLECYSTECTOMY (N/A Abdomen)  Patient Location: PACU  Anesthesia Type:General  Level of Consciousness: awake, alert  and oriented  Airway & Oxygen Therapy: Patient Spontanous Breathing and Patient connected to face mask oxygen  Post-op Assessment: Report given to RN and Post -op Vital signs reviewed and stable  Post vital signs: Reviewed and stable  Last Vitals:  Vitals Value Taken Time  BP 164/99 12/09/20 1200  Temp    Pulse 91 12/09/20 1201  Resp 16 12/09/20 1201  SpO2 98 % 12/09/20 1201  Vitals shown include unvalidated device data.  Last Pain:  Vitals:   12/09/20 0909  TempSrc:   PainSc: 0-No pain      Patients Stated Pain Goal: 3 (12/09/20 0909)  Complications: No complications documented.

## 2020-12-09 NOTE — H&P (Signed)
    Admitting Physician: [name]  Service: General surgery  CC: Abdominal pain  Subjective   HPI: James Jimenez is an 55 y.o. male who is here for elective cholecystectomy.  He was seen evaluated in the office and found to have gallstones on right upper quadrant ultrasound with biliary colic.  Past Medical History:  Diagnosis Date  . Asthma    reactive airway - uses inhaler   . Hyperlipidemia   . Hypertension   . Left inguinal hernia     Past Surgical History:  Procedure Laterality Date  . HERNIA REPAIR  1995 1991    Family History  Problem Relation Age of Onset  . Hypertension Mother   . Arthritis Mother   . Cancer Mother   . Hyperlipidemia Father   . Cancer Father   . Hypertension Sister   . Gallbladder disease Sister   . Gallbladder disease Sister   . Arthritis Maternal Grandmother   . Hypertension Maternal Grandmother   . Kidney disease Maternal Grandmother   . Cancer Maternal Grandfather   . Epilepsy Maternal Grandfather   . Neuropathy Paternal Grandfather     Social:  reports that he has never smoked. He has never used smokeless tobacco. He reports that he does not drink alcohol and does not use drugs.  Allergies: No Known Allergies  Medications: Current Outpatient Medications  Medication Instructions  . albuterol (VENTOLIN HFA) 108 (90 Base) MCG/ACT inhaler 2 puffs, Inhalation, Every 6 hours PRN  . amLODipine (NORVASC) 5 mg, Oral, Daily    ROS - all of the below systems have been reviewed with the patient and positives are indicated with bold text General: chills, fever or night sweats Eyes: blurry vision or double vision ENT: epistaxis or sore throat Allergy/Immunology: itchy/watery eyes or nasal congestion Hematologic/Lymphatic: bleeding problems, blood clots or swollen lymph nodes Endocrine: temperature intolerance or unexpected weight changes Breast: new or changing breast lumps or nipple discharge Resp: cough, shortness of  breath, or wheezing CV: chest pain or dyspnea on exertion GI: as per HPI GU: dysuria, trouble voiding, or hematuria MSK: joint pain or joint stiffness Neuro: TIA or stroke symptoms Derm: pruritus and skin lesion changes Psych: anxiety and depression  Objective   PE Blood pressure (!) 165/106, pulse 61, temperature 98.1 F (36.7 C), temperature source Oral, resp. rate 16, height 6\' 1"  (1.854 m), weight 106.6 kg, SpO2 100 %. Constitutional: NAD; conversant; no deformities Eyes: Moist conjunctiva; no lid lag; anicteric; PERRL Neck: Trachea midline; no thyromegaly Lungs: Normal respiratory effort; no tactile fremitus CV: RRR; no palpable thrills; no pitting edema GI: Abd soft, nontender, nondistended; no palpable hepatosplenomegaly MSK: Normal range of motion of extremities; no clubbing/cyanosis Psychiatric: Appropriate affect; alert and oriented x3 Lymphatic: No palpable cervical or axillary lymphadenopathy  No results found for this or any previous visit (from the past 24 hour(s)).  Right upper quadrant ultrasound on 09/23/2020 -gallstones  Assessment and Plan   [name] is an 55 y.o. male with biliary colic, here for elective cholecystectomy.  The procedure itself as well as its risk, benefits, and alternatives were discussed with the patient granted consent to proceed.  We will proceed to the operating room as scheduled.  57, M.D. General, Bariatric and Minimally Invasive Surgery  [facility] Use [email or web address] to contact on call provider

## 2020-12-10 ENCOUNTER — Encounter (HOSPITAL_COMMUNITY): Payer: Self-pay | Admitting: Surgery

## 2020-12-10 LAB — SURGICAL PATHOLOGY

## 2020-12-17 ENCOUNTER — Ambulatory Visit: Payer: 59 | Admitting: Gastroenterology

## 2020-12-19 ENCOUNTER — Encounter: Payer: Self-pay | Admitting: Family Medicine

## 2020-12-19 ENCOUNTER — Other Ambulatory Visit: Payer: Self-pay

## 2020-12-19 ENCOUNTER — Telehealth (INDEPENDENT_AMBULATORY_CARE_PROVIDER_SITE_OTHER): Payer: 59 | Admitting: Family Medicine

## 2020-12-19 VITALS — Wt 233.0 lb

## 2020-12-19 DIAGNOSIS — J387 Other diseases of larynx: Secondary | ICD-10-CM

## 2020-12-19 NOTE — Progress Notes (Signed)
      James Winslett T. Eulalio Reamy, MD Primary Care and Sports Medicine [facility] [address] [place], [address] Phone: [phone]  FAX: [phone]  James Jimenez - 55 y.o. male  MRN [id number]  Date of Birth: 1966-04-14  Visit Date: 12/19/2020  PCP: Ardith Dark, MD  Referred by: Ardith Dark, MD  Virtual Visit via Video Note:  I connected with  James Jimenez on 12/19/2020  9:00 AM EST by a video enabled telemedicine application and verified that I am speaking with the correct person using two identifiers.   Location patient: home computer, tablet, or smartphone Location provider: work or home office Consent: Verbal consent directly obtained from [facility]. Persons participating in the virtual visit: patient, provider  I discussed the limitations of evaluation and management by telemedicine and the availability of in person appointments. The patient expressed understanding and agreed to proceed.  Chief Complaint  Patient presents with  . Ulcer on Throat    Had gallbladder surgery last week. Has been hurting since. Noticed the ulcer a few days later. Has tried warm salt water.    History of Present Illness:  GB last week, then throat has been sore.  There is an ulcer on the soft palate.  This initially was there after his gallbladder surgery.  It is uncomfortable, and now he has an ulcer.  He has tried some salt water gargles, but this is persisted.  He has not had any other systemic symptoms including no fevers, chills, cough, runny nose, or any other URI type symptoms.  Review of Systems as above: See pertinent positives and pertinent negatives per HPI No acute distress verbally   Observations/Objective/Exam:  An attempt was made to discern vital signs over the phone and per patient if applicable and possible.   General:    Alert, Oriented, appears well and in no acute distress  Pulmonary:     On inspection no  signs of respiratory distress.  Psych / Neurological:     Pleasant and cooperative.  Assessment and Plan:    ICD-10-CM   1. Throat ulcer  J38.7    Visualized through the computer, very small area, more likely just from mechanical irritation from intubation.  Much less likely viral.  I am going to give him some Magic mouthwash, and continue until his body heals.  I discussed the assessment and treatment plan with the patient. The patient was provided an opportunity to ask questions and all were answered. The patient agreed with the plan and demonstrated an understanding of the instructions.   The patient was advised to call back or seek an in-person evaluation if the symptoms worsen or if the condition fails to improve as anticipated.  Mix: 80 ml maalox, 80 ml benadryl susp, 80 mL lidocaine 1%   1 tsp po gargle q 4 hours prn sore throat  #240 mL total   I called in myself now.   Signed,  Elpidio Galea. Nicholes Hibler, MD

## 2021-02-05 ENCOUNTER — Encounter: Payer: Self-pay | Admitting: Family Medicine

## 2021-09-15 ENCOUNTER — Encounter: Payer: 59 | Admitting: Family Medicine

## 2021-09-23 ENCOUNTER — Other Ambulatory Visit: Payer: Self-pay | Admitting: *Deleted

## 2021-09-23 ENCOUNTER — Telehealth: Payer: Self-pay

## 2021-09-23 MED ORDER — AMLODIPINE BESYLATE 5 MG PO TABS: 5.0000 mg | ORAL_TABLET | Freq: Every day | ORAL | 3 refills | Status: DC

## 2021-09-23 NOTE — Telephone Encounter (Signed)
  Encourage patient to contact the pharmacy for refills or they can request refills through [facility]  LAST APPOINTMENT DATE: 09/14/2020  NEXT APPOINTMENT DATE: 11/12/2021  MEDICATION:amLODipine (NORVASC) 5 MG tablet  Is the patient out of medication? NO  PHARMACY:[facility] [address] [place], [place] - [address]  Let patient know to contact pharmacy at the end of the day to make sure medication is ready.  Please notify patient to allow 48-72 hours to process

## 2021-09-23 NOTE — Telephone Encounter (Signed)
Rx send to pharmacy,patient aware  

## 2021-11-10 ENCOUNTER — Ambulatory Visit (INDEPENDENT_AMBULATORY_CARE_PROVIDER_SITE_OTHER): Payer: 59 | Admitting: Family Medicine

## 2021-11-10 ENCOUNTER — Encounter: Payer: Self-pay | Admitting: Family Medicine

## 2021-11-10 ENCOUNTER — Other Ambulatory Visit: Payer: Self-pay

## 2021-11-10 VITALS — BP 137/84 | HR 56 | Temp 98.0°F | Ht 73.0 in | Wt 243.2 lb

## 2021-11-10 DIAGNOSIS — R351 Nocturia: Secondary | ICD-10-CM | POA: Diagnosis not present

## 2021-11-10 DIAGNOSIS — I10 Essential (primary) hypertension: Secondary | ICD-10-CM

## 2021-11-10 DIAGNOSIS — Z125 Encounter for screening for malignant neoplasm of prostate: Secondary | ICD-10-CM | POA: Diagnosis not present

## 2021-11-10 DIAGNOSIS — Z8042 Family history of malignant neoplasm of prostate: Secondary | ICD-10-CM

## 2021-11-10 DIAGNOSIS — N529 Male erectile dysfunction, unspecified: Secondary | ICD-10-CM

## 2021-11-10 DIAGNOSIS — E663 Overweight: Secondary | ICD-10-CM

## 2021-11-10 DIAGNOSIS — J45909 Unspecified asthma, uncomplicated: Secondary | ICD-10-CM

## 2021-11-10 DIAGNOSIS — Z6832 Body mass index (BMI) 32.0-32.9, adult: Secondary | ICD-10-CM

## 2021-11-10 DIAGNOSIS — Z0001 Encounter for general adult medical examination with abnormal findings: Secondary | ICD-10-CM | POA: Diagnosis not present

## 2021-11-10 DIAGNOSIS — R739 Hyperglycemia, unspecified: Secondary | ICD-10-CM

## 2021-11-10 DIAGNOSIS — E785 Hyperlipidemia, unspecified: Secondary | ICD-10-CM | POA: Diagnosis not present

## 2021-11-10 DIAGNOSIS — R399 Unspecified symptoms and signs involving the genitourinary system: Secondary | ICD-10-CM

## 2021-11-10 LAB — TSH: TSH: 1.83 u[IU]/mL (ref 0.35–5.50)

## 2021-11-10 LAB — URINALYSIS, ROUTINE W REFLEX MICROSCOPIC
Bilirubin Urine: NEGATIVE
Hgb urine dipstick: NEGATIVE
Ketones, ur: NEGATIVE
Leukocytes,Ua: NEGATIVE
Nitrite: NEGATIVE
RBC / HPF: NONE SEEN (ref 0–?)
Specific Gravity, Urine: <=1.005 — AB (ref 1.000–1.030)
Total Protein, Urine: NEGATIVE
Urine Glucose: NEGATIVE
Urobilinogen, UA: 0.2 (ref 0.0–1.0)
WBC, UA: NONE SEEN (ref 0–?)
pH: 6 (ref 5.0–8.0)

## 2021-11-10 LAB — COMPREHENSIVE METABOLIC PANEL
ALT: 24 U/L (ref 0–53)
AST: 19 U/L (ref 0–37)
Albumin: 4.4 g/dL (ref 3.5–5.2)
Alkaline Phosphatase: 66 U/L (ref 39–117)
BUN: 17 mg/dL (ref 6–23)
CO2: 28 mEq/L (ref 19–32)
Calcium: 9.6 mg/dL (ref 8.4–10.5)
Chloride: 105 mEq/L (ref 96–112)
Creatinine, Ser: 1.16 mg/dL (ref 0.40–1.50)
GFR: 70.11 mL/min (ref >60.00)
Glucose, Bld: 90 mg/dL (ref 70–99)
Potassium: 4.5 mEq/L (ref 3.5–5.1)
Sodium: 141 mEq/L (ref 135–145)
Total Bilirubin: 0.5 mg/dL (ref 0.2–1.2)
Total Protein: 7.1 g/dL (ref 6.0–8.3)

## 2021-11-10 LAB — LIPID PANEL
Cholesterol: 183 mg/dL (ref 0–200)
HDL: 39.9 mg/dL (ref >39.00)
NonHDL: 143.33
Total CHOL/HDL Ratio: 5
Triglycerides: 223 mg/dL — ABNORMAL HIGH (ref 0.0–149.0)
VLDL: 44.6 mg/dL — ABNORMAL HIGH (ref 0.0–40.0)

## 2021-11-10 LAB — CBC
HCT: 43.9 % (ref 39.0–52.0)
Hemoglobin: 14.8 g/dL (ref 13.0–17.0)
MCHC: 33.7 g/dL (ref 30.0–36.0)
MCV: 89.4 fl (ref 78.0–100.0)
Platelets: 215 10*3/uL (ref 150.0–400.0)
RBC: 4.91 Mil/uL (ref 4.22–5.81)
RDW: 13.4 % (ref 11.5–15.5)
WBC: 6.2 10*3/uL (ref 4.0–10.5)

## 2021-11-10 LAB — PSA: PSA: 4.81 ng/mL — ABNORMAL HIGH (ref 0.10–4.00)

## 2021-11-10 LAB — LDL CHOLESTEROL, DIRECT: Direct LDL: 115 mg/dL

## 2021-11-10 LAB — HEMOGLOBIN A1C: Hgb A1c MFr Bld: 6 % (ref 4.6–6.5)

## 2021-11-10 NOTE — Assessment & Plan Note (Signed)
Check PSA today. 

## 2021-11-10 NOTE — Assessment & Plan Note (Signed)
Likely due to BPH.  We will check PSA today.  Also check urinalysis and urine culture to rule out other possible causes.  We discussed treatment options however he does not wish to start medications at this point.  He will let me know if symptoms progress or if he would like to change medication before next office visit.

## 2021-11-10 NOTE — Assessment & Plan Note (Signed)
Slightly above goal.  He was at goal last year.  We will continue current regimen amlodipine 5 mg daily.  He will continue working on diet and exercise.  He will also monitor at home and let me know if it is persistently 140/90 or higher.

## 2021-11-10 NOTE — Assessment & Plan Note (Signed)
Does not wish to start medication at this point.  Would be a good candidate for Cialis given his concurrent LUTS.  He will let me know if he changes mind on medications.

## 2021-11-10 NOTE — Patient Instructions (Signed)
It was very nice to see you today!  We will check blood work today.  Please let me know if you would like to start a medication for your prostate symptoms.  Please keep an eye on your blood pressure and let me know if it is persistently 140/90 or higher.  Please continue working on diet and exercise.  We will see you back in 1 year for your next physical.  Please come back sooner if needed.  Take care, Dr [name]  PLEASE NOTE:  If you had any lab tests please let us know if you have not heard back within a few days. You may see your results on mychart before we have a chance to review them but we will give you a call once they are reviewed by [place]. If we ordered any referrals today, please let us know if you have not heard from their office within the next week.   Please try these tips to maintain a healthy lifestyle:  Eat at least 3 REAL meals and 1-2 snacks per day.  Aim for no more than 5 hours between eating.  If you eat breakfast, please do so within one hour of getting up.   Each meal should contain half fruits/vegetables, one quarter protein, and one quarter carbs (no bigger than a computer mouse)  Cut down on sweet beverages. This includes juice, soda, and sweet tea.   Drink at least 1 glass of water with each meal and aim for at least 8 glasses per day  Exercise at least 150 minutes every week.    Preventive Care 63-40 Years Old, Male Preventive care refers to lifestyle choices and visits with your health care provider that can promote health and wellness. Preventive care visits are also called wellness exams. What can I expect for my preventive care visit? Counseling During your preventive care visit, your health care provider may ask about your: Medical history, including: Past medical problems. Family medical history. Current health, including: Emotional well-being. Home life and relationship well-being. Sexual activity. Lifestyle, including: Alcohol, nicotine or  tobacco, and drug use. Access to firearms. Diet, exercise, and sleep habits. Safety issues such as seatbelt and bike helmet use. Sunscreen use. Work and work Astronomer. Physical exam Your health care provider will check your: Height and weight. These may be used to calculate your BMI (body mass index). BMI is a measurement that tells if you are at a healthy weight. Waist circumference. This measures the distance around your waistline. This measurement also tells if you are at a healthy weight and may help predict your risk of certain diseases, such as type 2 diabetes and high blood pressure. Heart rate and blood pressure. Body temperature. Skin for abnormal spots. What immunizations do I need? Vaccines are usually given at various ages, according to a schedule. Your health care provider will recommend vaccines for you based on your age, medical history, and lifestyle or other factors, such as travel or where you work. What tests do I need? Screening Your health care provider may recommend screening tests for certain conditions. This may include: Lipid and cholesterol levels. Diabetes screening. This is done by checking your blood sugar (glucose) after you have not eaten for a while (fasting). Hepatitis B test. Hepatitis C test. HIV (human immunodeficiency virus) test. STI (sexually transmitted infection) testing, if you are at risk. Lung cancer screening. Prostate cancer screening. Colorectal cancer screening. Talk with your health care provider about your test results, treatment options, and if necessary, the  need for more tests. Follow these instructions at home: Eating and drinking  Eat a diet that includes fresh fruits and vegetables, whole grains, lean protein, and low-fat dairy products. Take vitamin and mineral supplements as recommended by your health care provider. Do not drink alcohol if your health care provider tells you not to drink. If you drink alcohol: Limit how  much you have to 0-2 drinks a day. Know how much alcohol is in your drink. In the [place] one drink equals one 12 oz bottle of beer (355 mL), one 5 oz glass of wine (148 mL), or one 1 oz glass of hard liquor (44 mL). Lifestyle Brush your teeth every morning and night with fluoride toothpaste. Floss one time each day. Exercise for at least 30 minutes 5 or more days each week. Do not use any products that contain nicotine or tobacco. These products include cigarettes, chewing tobacco, and vaping devices, such as e-cigarettes. If you need help quitting, ask your health care provider. Do not use drugs. If you are sexually active, practice safe sex. Use a condom or other form of protection to prevent STIs. Take aspirin only as told by your health care provider. Make sure that you understand how much to take and what form to take. Work with your health care provider to find out whether it is safe and beneficial for you to take aspirin daily. Find healthy ways to manage stress, such as: Meditation, yoga, or listening to music. Journaling. Talking to a trusted person. Spending time with friends and family. Minimize exposure to UV radiation to reduce your risk of skin cancer. Safety Always wear your seat belt while driving or riding in a vehicle. Do not drive: If you have been drinking alcohol. Do not ride with someone who has been drinking. When you are tired or distracted. While texting. If you have been using any mind-altering substances or drugs. Wear a helmet and other protective equipment during sports activities. If you have firearms in your house, make sure you follow all gun safety procedures. What's next? Go to your health care provider once a year for an annual wellness visit. Ask your health care provider how often you should have your eyes and teeth checked. Stay up to date on all vaccines. This information is not intended to replace advice given to you by your health care provider.  Make sure you discuss any questions you have with your health care provider. Document Revised: 05/19/2021 Document Reviewed: 05/19/2021 Elsevier Patient Education  2022 [employer].

## 2021-11-10 NOTE — Assessment & Plan Note (Signed)
Stable.  Uses albuterol as needed.

## 2021-11-10 NOTE — Progress Notes (Signed)
Chief Complaint:  James Jimenez is a 55 y.o. male who presents today for his annual comprehensive physical exam.    Assessment/Plan:  Chronic Problems Addressed Today: Reactive airway disease without complication Stable.  Uses albuterol as needed.  Lower urinary tract symptoms (LUTS) Likely due to BPH.  We will check PSA today.  Also check urinalysis and urine culture to rule out other possible causes.  We discussed treatment options however he does not wish to start medications at this point.  He will let me know if symptoms progress or if he would like to change medication before next office visit.  Erectile dysfunction Does not wish to start medication at this point.  Would be a good candidate for Cialis given his concurrent LUTS.  He will let me know if he changes mind on medications.  Family history of prostate cancer Check PSA today.   Essential hypertension Slightly above goal.  He was at goal last year.  We will continue current regimen amlodipine 5 mg daily.  He will continue working on diet and exercise.  He will also monitor at home and let me know if it is persistently 140/90 or higher.   Body mass index is 32.09 kg/m. / Obese  BMI Metric Follow Up - 11/10/21 0959       BMI Metric Follow Up-Please document annually   BMI Metric Follow Up Education provided              Preventative Healthcare: Declines flu shot, 2 Covid-19 vaccines. Colonoscopy next year.  Colonoscopy- Last completed on 07/25/2017. There were three polyps in the sigmoid colon resected and retrieved. Presence of internal hemorrhoids. Will repeat next year.Marland Kitchen    PSA- Last checked on 09/14/2020. Reading was 2.88. Recheck today.   Patient Counseling(The following topics were reviewed and/or handout was given):  -Nutrition: Stressed importance of moderation in sodium/caffeine intake, saturated fat and cholesterol, caloric balance, sufficient intake of fresh fruits, vegetables, and fiber.   -Stressed the importance of regular exercise.   -Substance Abuse: Discussed cessation/primary prevention of tobacco, alcohol, or other drug use; driving or other dangerous activities under the influence; availability of treatment for abuse.   -Injury prevention: Discussed safety belts, safety helmets, smoke detector, smoking near bedding or upholstery.   -Sexuality: Discussed sexually transmitted diseases, partner selection, use of condoms, avoidance of unintended pregnancy and contraceptive alternatives.   -Dental health: Discussed importance of regular tooth brushing, flossing, and dental visits.  -Health maintenance and immunizations reviewed. Please refer to Health maintenance section.  Return to care in 1 year for next preventative visit.     Subjective:  HPI:  He has some acute complaints today.   He reports an increase in urinary frequency since he turned 73. Notes that it has been worsening in the last year. Endorses nocturia. Woke up 4 times last night to urinate. Does not think it is too bothersome. He mentions there is also erectile dysfunction, maintaining erections. Father had a mild case of prostate cancer and is in remission now. Would like to check PSA.  His blood pressure is elevated at this visit. 145/84. Does not check pressure at home.   He is doing well post-op cholecystectomy. No diarrhea. Got Covid-19 in the hospital after surgery.   Lifestyle Diet: Not maintaining healthy diet. Will start low-fat diet at end of year. Exercise: Not regular. Depends on work schedule. Weight fluctuates depending on what he eats.   Depression screen [facility] 2/9 11/10/2021  Decreased Interest 0  Down, Depressed, Hopeless 0  PHQ - 2 Score 0    Health Maintenance Due  Topic Date Due   HIV Screening  Never done   Hepatitis C Screening  Never done   TETANUS/TDAP  Never done   Zoster Vaccines- Shingrix (1 of 2) Never done   COVID-19 Vaccine (3 - Booster for Pfizer series) 05/20/2020       PMH:  The following were reviewed and entered/updated in epic: Past Medical History:  Diagnosis Date   Asthma    reactive airway - uses inhaler    Hyperlipidemia    Hypertension    Left inguinal hernia    Patient Active Problem List   Diagnosis Date Noted   Lower urinary tract symptoms (LUTS) 11/10/2021   Erectile dysfunction 11/10/2021   Family history of prostate cancer 11/10/2021   Essential hypertension 09/14/2020   Reactive airway disease without complication 09/14/2020   Past Surgical History:  Procedure Laterality Date   CHOLECYSTECTOMY N/A 12/09/2020   Procedure: LAPAROSCOPIC CHOLECYSTECTOMY;  Surgeon: Quentin Ore, MD;  Location: [facility];  Service: General;  Laterality: N/A;   HERNIA REPAIR  1995 1991    Family History  Problem Relation Age of Onset   Hypertension Mother    Arthritis Mother    Cancer Mother    Hyperlipidemia Father    Prostate cancer Father    Hypertension Sister    Gallbladder disease Sister    Gallbladder disease Sister    Arthritis Maternal Grandmother    Hypertension Maternal Grandmother    Kidney disease Maternal Grandmother    Cancer Maternal Grandfather    Epilepsy Maternal Grandfather    Neuropathy Paternal Grandfather     Medications- reviewed and updated Current Outpatient Medications  Medication Sig Dispense Refill   albuterol (VENTOLIN HFA) 108 (90 Base) MCG/ACT inhaler Inhale 2 puffs into the lungs every 6 (six) hours as needed for wheezing or shortness of breath. 8 g 0   amLODipine (NORVASC) 5 MG tablet Take 1 tablet (5 mg total) by mouth daily. 90 tablet 3   No current facility-administered medications for this visit.    Allergies-reviewed and updated No Known Allergies  Social History   Socioeconomic History   Marital status: Married    Spouse name: Not on file   Number of children: Not on file   Years of education: Not on file   Highest education level: Not on file  Occupational History   Not on  file  Tobacco Use   Smoking status: Never   Smokeless tobacco: Never  Vaping Use   Vaping Use: Never used  Substance and Sexual Activity   Alcohol use: Never   Drug use: Never   Sexual activity: Not on file  Other Topics Concern   Not on file  Social History Narrative   Not on file   Social Determinants of Health   Financial Resource Strain: Not on file  Food Insecurity: Not on file  Transportation Needs: Not on file  Physical Activity: Not on file  Stress: Not on file  Social Connections: Not on file         Objective:  Physical Exam: BP 137/84   Pulse (!) 56   Temp 98 F (36.7 C) (Temporal)   Ht 6\' 1"  (1.854 m)   Wt 243 lb 3.2 oz (110.3 kg)   SpO2 99%   BMI 32.09 kg/m   Body mass index is 32.09 kg/m. Wt Readings from Last 3 Encounters:  11/10/21 243 lb 3.2  oz (110.3 kg)  12/19/20 233 lb (105.7 kg)  12/09/20 235 lb (106.6 kg)   Gen: NAD, resting comfortably HEENT: TMs normal bilaterally. OP clear. No thyromegaly noted.  CV: RRR with no murmurs appreciated Pulm: NWOB, CTAB with no crackles, wheezes, or rhonchi GI: Normal bowel sounds present. Soft, Nontender, Nondistended. MSK: no edema, cyanosis, or clubbing noted Skin: warm, dry Neuro: CN2-12 grossly intact. Strength 5/5 in upper and lower extremities. Reflexes symmetric and intact bilaterally.  Psych: Normal affect and thought content       I,Zite Okoli,acting as a scribe for Jacquiline Doe, MD.,have documented all relevant documentation on the behalf of Jacquiline Doe, MD,as directed by  Jacquiline Doe, MD while in the presence of Jacquiline Doe, MD.   I, Jacquiline Doe, MD, have reviewed all documentation for this visit. The documentation on 11/10/21 for the exam, diagnosis, procedures, and orders are all accurate and complete.  [name]. [name], MD 11/10/2021 10:03 AM

## 2021-11-11 ENCOUNTER — Encounter: Payer: Self-pay | Admitting: Family Medicine

## 2021-11-11 DIAGNOSIS — R739 Hyperglycemia, unspecified: Secondary | ICD-10-CM | POA: Insufficient documentation

## 2021-11-11 DIAGNOSIS — E785 Hyperlipidemia, unspecified: Secondary | ICD-10-CM | POA: Insufficient documentation

## 2021-11-11 LAB — URINE CULTURE
MICRO NUMBER:: 12726068
Result:: NO GROWTH
SPECIMEN QUALITY:: ADEQUATE

## 2021-11-11 NOTE — Progress Notes (Signed)
Please inform patient of the following:  His PSA is borderline elevated. Recommend referral to urology for further management.  His cholesterol and blood sugar are borderline elevated. We can start a cholesterol medication if he wishes. Please send in lipitor 20mg  daily if he is willing to start. Regardless, he should continue working on diet and exercise and we can recheck in a year. Everything else is NORMAL.  . [name], MD 11/11/2021 1:07 PM

## 2021-11-12 NOTE — Progress Notes (Signed)
Please inform patient of the following:  Urine culture is negative.  James Jimenez. [name], MD 11/12/2021 4:04 PM

## 2022-03-02 ENCOUNTER — Emergency Department (HOSPITAL_BASED_OUTPATIENT_CLINIC_OR_DEPARTMENT_OTHER): Payer: 59

## 2022-03-02 ENCOUNTER — Encounter (HOSPITAL_BASED_OUTPATIENT_CLINIC_OR_DEPARTMENT_OTHER): Payer: Self-pay | Admitting: Emergency Medicine

## 2022-03-02 ENCOUNTER — Emergency Department (HOSPITAL_BASED_OUTPATIENT_CLINIC_OR_DEPARTMENT_OTHER)
Admission: EM | Admit: 2022-03-02 | Discharge: 2022-03-02 | Disposition: A | Discharge status: Home / Self Care | Payer: 59 | Attending: Emergency Medicine | Admitting: Emergency Medicine

## 2022-03-02 ENCOUNTER — Other Ambulatory Visit: Payer: Self-pay

## 2022-03-02 DIAGNOSIS — R079 Chest pain, unspecified: Secondary | ICD-10-CM | POA: Insufficient documentation

## 2022-03-02 DIAGNOSIS — M4312 Spondylolisthesis, cervical region: Secondary | ICD-10-CM | POA: Diagnosis not present

## 2022-03-02 DIAGNOSIS — M2578 Osteophyte, vertebrae: Secondary | ICD-10-CM | POA: Diagnosis not present

## 2022-03-02 DIAGNOSIS — S0993XA Unspecified injury of face, initial encounter: Secondary | ICD-10-CM | POA: Diagnosis not present

## 2022-03-02 DIAGNOSIS — M25512 Pain in left shoulder: Secondary | ICD-10-CM | POA: Diagnosis not present

## 2022-03-02 DIAGNOSIS — S4992XA Unspecified injury of left shoulder and upper arm, initial encounter: Secondary | ICD-10-CM | POA: Diagnosis not present

## 2022-03-02 DIAGNOSIS — Y92410 Unspecified street and highway as the place of occurrence of the external cause: Secondary | ICD-10-CM | POA: Diagnosis not present

## 2022-03-02 DIAGNOSIS — S0011XA Contusion of right eyelid and periocular area, initial encounter: Secondary | ICD-10-CM | POA: Insufficient documentation

## 2022-03-02 DIAGNOSIS — M542 Cervicalgia: Secondary | ICD-10-CM | POA: Diagnosis not present

## 2022-03-02 DIAGNOSIS — S299XXA Unspecified injury of thorax, initial encounter: Secondary | ICD-10-CM | POA: Diagnosis not present

## 2022-03-02 MED ORDER — ACETAMINOPHEN 325 MG PO TABS
650.0000 mg | ORAL_TABLET | Freq: Once | ORAL | Status: AC
  Administered 2022-03-02: 650 mg via ORAL
  Filled 2022-03-02: qty 2

## 2022-03-02 MED ORDER — NAPROXEN 500 MG PO TABS: 500.0000 mg | ORAL_TABLET | Freq: Two times a day (BID) | ORAL | 0 refills | Status: AC

## 2022-03-02 MED ORDER — CYCLOBENZAPRINE HCL 10 MG PO TABS
10.0000 mg | ORAL_TABLET | Freq: Once | ORAL | Status: AC
Start: 2022-03-02 — End: 2022-03-02
  Administered 2022-03-02: 10 mg via ORAL
  Filled 2022-03-02: qty 1

## 2022-03-02 MED ORDER — CYCLOBENZAPRINE HCL 10 MG PO TABS: 10.0000 mg | ORAL_TABLET | Freq: Three times a day (TID) | ORAL | 0 refills | Status: AC | PRN

## 2022-03-02 NOTE — ED Notes (Signed)
Pt requested Ginger Ale, OK per RN Shanda Bumps ?

## 2022-03-02 NOTE — Discharge Instructions (Addendum)
We discussed the results of your x-rays on today's visit. ? ?I prescribed a short course of muscle relaxers along with anti-inflammatories.  Please take these as prescribed. ? ?You may also add ice, heat to your left shoulder to help with your symptoms.  If the symptoms continue you may need to follow-up with orthopedics. ? ?If you experience a headache, any any symptoms you will need to return to the emergency department. ?
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
## 2022-03-02 NOTE — ED Notes (Signed)
Rx x 2 given  Written and verbal inst to pt  Verbalized an understanding  To home with family 

## 2022-03-09 ENCOUNTER — Ambulatory Visit: Payer: Self-pay

## 2022-03-09 ENCOUNTER — Ambulatory Visit (INDEPENDENT_AMBULATORY_CARE_PROVIDER_SITE_OTHER): Payer: Self-pay | Admitting: Surgery

## 2022-03-09 DIAGNOSIS — S4362XA Sprain of left sternoclavicular joint, initial encounter: Secondary | ICD-10-CM

## 2022-03-09 DIAGNOSIS — M542 Cervicalgia: Secondary | ICD-10-CM
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
## 2022-03-09 NOTE — Patient Instructions (Signed)
Wear left shoulder sling.  No lifting, pushing, pulling with left arm.  Can work range of motion of elbow to prevent stiffness.  ? ?Ice area off and on a couple of times per day.   ? ?Follow up with me in one week for recheck.  May consider getting MRI or CT scan of the affected area if no improvement.   ? ?

## 2022-03-16 ENCOUNTER — Encounter: Payer: Self-pay | Admitting: Surgery

## 2022-03-16 ENCOUNTER — Ambulatory Visit (INDEPENDENT_AMBULATORY_CARE_PROVIDER_SITE_OTHER): Payer: Self-pay | Admitting: Surgery

## 2022-03-16 DIAGNOSIS — S4362XA Sprain of left sternoclavicular joint, initial encounter: Secondary | ICD-10-CM

## 2022-03-30 ENCOUNTER — Encounter: Payer: Self-pay | Admitting: Orthopaedic Surgery

## 2022-03-30 ENCOUNTER — Ambulatory Visit (INDEPENDENT_AMBULATORY_CARE_PROVIDER_SITE_OTHER): Payer: 59 | Admitting: Orthopaedic Surgery

## 2022-03-30 DIAGNOSIS — S40012A Contusion of left shoulder, initial encounter: Secondary | ICD-10-CM | POA: Diagnosis not present
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
## 2022-04-02 DIAGNOSIS — S40019A Contusion of unspecified shoulder, initial encounter: Secondary | ICD-10-CM | POA: Insufficient documentation
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
## 2022-07-20 DIAGNOSIS — J45909 Unspecified asthma, uncomplicated: Secondary | ICD-10-CM | POA: Diagnosis not present

## 2022-07-20 DIAGNOSIS — I10 Essential (primary) hypertension: Secondary | ICD-10-CM | POA: Diagnosis not present

## 2022-07-20 DIAGNOSIS — Z8249 Family history of ischemic heart disease and other diseases of the circulatory system: Secondary | ICD-10-CM | POA: Diagnosis not present

## 2022-07-20 DIAGNOSIS — Z6831 Body mass index (BMI) 31.0-31.9, adult: Secondary | ICD-10-CM | POA: Diagnosis not present

## 2022-07-20 DIAGNOSIS — Z809 Family history of malignant neoplasm, unspecified: Secondary | ICD-10-CM | POA: Diagnosis not present

## 2022-07-20 DIAGNOSIS — E669 Obesity, unspecified: Secondary | ICD-10-CM | POA: Diagnosis not present

## 2022-08-09 DIAGNOSIS — Z23 Encounter for immunization: Secondary | ICD-10-CM | POA: Diagnosis not present

## 2022-08-29 ENCOUNTER — Encounter: Payer: Self-pay | Admitting: *Deleted

## 2022-10-12 ENCOUNTER — Other Ambulatory Visit: Payer: Self-pay | Admitting: Family Medicine

## 2022-11-02 ENCOUNTER — Encounter: Payer: Self-pay | Admitting: Orthopaedic Surgery

## 2022-11-02 ENCOUNTER — Ambulatory Visit (INDEPENDENT_AMBULATORY_CARE_PROVIDER_SITE_OTHER): Payer: 59

## 2022-11-02 ENCOUNTER — Ambulatory Visit (INDEPENDENT_AMBULATORY_CARE_PROVIDER_SITE_OTHER): Payer: 59 | Admitting: Orthopaedic Surgery

## 2022-11-02 VITALS — BP 168/81 | Ht 73.0 in | Wt 230.0 lb

## 2022-11-02 DIAGNOSIS — M25561 Pain in right knee: Secondary | ICD-10-CM

## 2022-11-02 DIAGNOSIS — G8929 Other chronic pain: Secondary | ICD-10-CM

## 2022-11-02 NOTE — Progress Notes (Signed)
Office Visit Note   Patient: James Jimenez           Date of Birth: 08-May-1966           MRN: [id number] Visit Date: 11/02/2022              Requested by: Ardith Dark, MD [address] [place],  [place] [address] PCP: Ardith Dark, MD   Assessment & Plan: Visit Diagnoses:  1. Chronic pain of right knee     Plan: We discussed with increased symptoms he can consider injection if he has persistent swelling pain.  We discussed using ibuprofen at therapeutic dosages for several days if he has increased symptoms.  No indication for MRI at this point without locking.  He can return if he has increased symptoms.  Follow-Up Instructions: No follow-ups on file.   Orders:  Orders Placed This Encounter  Procedures   XR KNEE 3 VIEW RIGHT   No orders of the defined types were placed in this encounter.     Procedures: No procedures performed   Clinical Data: No additional findings.   Subjective: Chief Complaint  Patient presents with   Right Knee - Pain    HPI 56 year old male with intermittent right knee pain related to activities.  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  He does not notice any change with weather occasionally if he is more active and does more climbing/crawling he will notice some swelling.  He has used ibuprofen with relief.  No past history of injury is not playing any regular sports or work active activities currently.  No history of rheumatologic conditions.  Review of Systems all systems noncontributory to HPI.   Objective: Vital Signs: BP (!) 168/81   Ht 6\' 1"  (1.854 m)   Wt 230 lb (104.3 kg)   BMI 30.34 kg/m   Physical Exam Constitutional:      Appearance: He is well-developed.  HENT:     Head: Normocephalic and atraumatic.     Right Ear: External ear normal.     Left Ear: External ear normal.  Eyes:     Pupils: Pupils are equal, round, and reactive to light.  Neck:     Thyroid: No  thyromegaly.     Trachea: No tracheal deviation.  Cardiovascular:     Rate and Rhythm: Normal rate.  Pulmonary:     Effort: Pulmonary effort is normal.     Breath sounds: No wheezing.  Abdominal:     General: Bowel sounds are normal.     Palpations: Abdomen is soft.  Musculoskeletal:     Cervical back: Neck supple.  Skin:    General: Skin is warm and dry.     Capillary Refill: Capillary refill takes less than 2 seconds.  Neurological:     Mental Status: He is alert and oriented to person, place, and time.  Psychiatric:        Behavior: Behavior normal.        Thought Content: Thought content normal.        Judgment: Judgment normal.     Ortho Exam minimal plica right knee only.  Negative patellar grind test negative subluxation ACL PCL is normal minimal crepitus with knee extension.  Pes bursa is normal.  Specialty Comments:  No specialty comments available.  Imaging: XR KNEE 3 VIEW RIGHT  Result Date: 11/02/2022 AP both knees bilateral sunrise x-ray lateral x-ray right knee are obtained and reviewed this shows maintained joint  space no spurring or joint space narrowing no acute changes. Impression: Normal right knee radiographs.    PMFS History: Patient Active Problem List   Diagnosis Date Noted   Shoulder contusion 04/02/2022   Dyslipidemia 11/11/2021   Hyperglycemia 11/11/2021   Lower urinary tract symptoms (LUTS) 11/10/2021   Erectile dysfunction 11/10/2021   Family history of prostate cancer 11/10/2021   Essential hypertension 09/14/2020   Reactive airway disease without complication 09/14/2020   Past Medical History:  Diagnosis Date   Asthma    reactive airway - uses inhaler    Hyperlipidemia    Hypertension    Left inguinal hernia     Family History  Problem Relation Age of Onset   Hypertension Mother    Arthritis Mother    Cancer Mother    Hyperlipidemia Father    Prostate cancer Father    Hypertension Sister    Gallbladder disease Sister     Gallbladder disease Sister    Arthritis Maternal Grandmother    Hypertension Maternal Grandmother    Kidney disease Maternal Grandmother    Cancer Maternal Grandfather    Epilepsy Maternal Grandfather    Neuropathy Paternal Grandfather     Past Surgical History:  Procedure Laterality Date   CHOLECYSTECTOMY N/A 12/09/2020   Procedure: LAPAROSCOPIC CHOLECYSTECTOMY;  Surgeon: Stechschulte, Hyman Hopes, MD;  Location: [facility];  Service: General;  Laterality: N/A;   HERNIA REPAIR  1995 1991   Social History   Occupational History   Not on file  Tobacco Use   Smoking status: Never   Smokeless tobacco: Never  Vaping Use   Vaping Use: Never used  Substance and Sexual Activity   Alcohol use: Never   Drug use: Never   Sexual activity: Not on file

## 2022-11-17 ENCOUNTER — Encounter: Payer: Self-pay | Admitting: *Deleted

## 2022-11-18 ENCOUNTER — Ambulatory Visit (INDEPENDENT_AMBULATORY_CARE_PROVIDER_SITE_OTHER): Payer: 59 | Admitting: Family Medicine

## 2022-11-18 ENCOUNTER — Encounter: Payer: Self-pay | Admitting: Family Medicine

## 2022-11-18 VITALS — BP 139/85 | HR 62 | Temp 98.4°F | Ht 73.0 in | Wt 236.2 lb

## 2022-11-18 DIAGNOSIS — I10 Essential (primary) hypertension: Secondary | ICD-10-CM

## 2022-11-18 DIAGNOSIS — R739 Hyperglycemia, unspecified: Secondary | ICD-10-CM | POA: Diagnosis not present

## 2022-11-18 DIAGNOSIS — R399 Unspecified symptoms and signs involving the genitourinary system: Secondary | ICD-10-CM

## 2022-11-18 DIAGNOSIS — Z8042 Family history of malignant neoplasm of prostate: Secondary | ICD-10-CM | POA: Diagnosis not present

## 2022-11-18 DIAGNOSIS — J45909 Unspecified asthma, uncomplicated: Secondary | ICD-10-CM | POA: Diagnosis not present

## 2022-11-18 DIAGNOSIS — Z1211 Encounter for screening for malignant neoplasm of colon: Secondary | ICD-10-CM | POA: Diagnosis not present

## 2022-11-18 DIAGNOSIS — Z0001 Encounter for general adult medical examination with abnormal findings: Secondary | ICD-10-CM | POA: Diagnosis not present

## 2022-11-18 DIAGNOSIS — E785 Hyperlipidemia, unspecified: Secondary | ICD-10-CM | POA: Diagnosis not present

## 2022-11-18 LAB — LIPID PANEL
Cholesterol: 171 mg/dL (ref 0–200)
HDL: 36.7 mg/dL — ABNORMAL LOW (ref >39.00)
NonHDL: 134.78
Total CHOL/HDL Ratio: 5
Triglycerides: 221 mg/dL — ABNORMAL HIGH (ref 0.0–149.0)
VLDL: 44.2 mg/dL — ABNORMAL HIGH (ref 0.0–40.0)

## 2022-11-18 LAB — COMPREHENSIVE METABOLIC PANEL
ALT: 18 U/L (ref 0–53)
AST: 17 U/L (ref 0–37)
Albumin: 4.5 g/dL (ref 3.5–5.2)
Alkaline Phosphatase: 64 U/L (ref 39–117)
BUN: 20 mg/dL (ref 6–23)
CO2: 29 mEq/L (ref 19–32)
Calcium: 9.6 mg/dL (ref 8.4–10.5)
Chloride: 107 mEq/L (ref 96–112)
Creatinine, Ser: 1.31 mg/dL (ref 0.40–1.50)
GFR: 60.16 mL/min (ref >60.00)
Glucose, Bld: 90 mg/dL (ref 70–99)
Potassium: 4.9 mEq/L (ref 3.5–5.1)
Sodium: 146 mEq/L — ABNORMAL HIGH (ref 135–145)
Total Bilirubin: 0.5 mg/dL (ref 0.2–1.2)
Total Protein: 6.8 g/dL (ref 6.0–8.3)

## 2022-11-18 LAB — TSH: TSH: 1.57 u[IU]/mL (ref 0.35–5.50)

## 2022-11-18 LAB — CBC
HCT: 42.9 % (ref 39.0–52.0)
Hemoglobin: 14.5 g/dL (ref 13.0–17.0)
MCHC: 33.7 g/dL (ref 30.0–36.0)
MCV: 90.1 fl (ref 78.0–100.0)
Platelets: 220 10*3/uL (ref 150.0–400.0)
RBC: 4.77 Mil/uL (ref 4.22–5.81)
RDW: 13 % (ref 11.5–15.5)
WBC: 6.7 10*3/uL (ref 4.0–10.5)

## 2022-11-18 LAB — PSA: PSA: 3.4 ng/mL (ref 0.10–4.00)

## 2022-11-18 LAB — LDL CHOLESTEROL, DIRECT: Direct LDL: 99 mg/dL

## 2022-11-18 LAB — HEMOGLOBIN A1C: Hgb A1c MFr Bld: 5.8 % (ref 4.6–6.5)

## 2022-11-18 MED ORDER — AMLODIPINE BESYLATE 5 MG PO TABS: 7.5000 mg | ORAL_TABLET | Freq: Every day | ORAL | 3 refills | Status: DC

## 2022-11-18 MED ORDER — ALBUTEROL SULFATE HFA 108 (90 BASE) MCG/ACT IN AERS: 2.0000 | INHALATION_SPRAY | Freq: Four times a day (QID) | RESPIRATORY_TRACT | 0 refills | Status: AC | PRN

## 2022-11-18 NOTE — Assessment & Plan Note (Addendum)
Stable.  Uses albuterol very infrequently. Will refill today.

## 2022-11-18 NOTE — Assessment & Plan Note (Signed)
Check PSA. ?

## 2022-11-18 NOTE — Assessment & Plan Note (Addendum)
He is not currently having any urinary symptoms.  We did check PSA last year which was borderline elevated.  We referred him to urology however he did not follow-up with them.  Thankfully his urinary symptoms seem to have improved. We will recheck his PSA today.  If PSA is uptrending will need to be referred back to urology.

## 2022-11-18 NOTE — Patient Instructions (Signed)
It was very nice to see you today!  We will check blood work today.  I will refer you for your colonoscopy.  Please continue taking 7.5 mg of amlodipine daily.  Keep an eye on your blood pressure at home and let me know if it is persistently elevated.  Please continue to work on diet and exercise.  I will see back in year for your next physical.  Come back sooner if needed.  Take care, Dr [name]  PLEASE NOTE:  If you had any lab tests, please let us know if you have not heard back within a few days. You may see your results on mychart before we have a chance to review them but we will give you a call once they are reviewed by [place].   If we ordered any referrals today, please let us know if you have not heard from their office within the next week.   If you had any urgent prescriptions sent in today, please check with the pharmacy within an hour of our visit to make sure the prescription was transmitted appropriately.   Please try these tips to maintain a healthy lifestyle:  Eat at least 3 REAL meals and 1-2 snacks per day.  Aim for no more than 5 hours between eating.  If you eat breakfast, please do so within one hour of getting up.   Each meal should contain half fruits/vegetables, one quarter protein, and one quarter carbs (no bigger than a computer mouse)  Cut down on sweet beverages. This includes juice, soda, and sweet tea.   Drink at least 1 glass of water with each meal and aim for at least 8 glasses per day  Exercise at least 150 minutes every week.    Preventive Care 16-40 Years Old, Male Preventive care refers to lifestyle choices and visits with your health care provider that can promote health and wellness. Preventive care visits are also called wellness exams. What can I expect for my preventive care visit? Counseling During your preventive care visit, your health care provider may ask about your: Medical history, including: Past medical problems. Family medical  history. Current health, including: Emotional well-being. Home life and relationship well-being. Sexual activity. Lifestyle, including: Alcohol, nicotine or tobacco, and drug use. Access to firearms. Diet, exercise, and sleep habits. Safety issues such as seatbelt and bike helmet use. Sunscreen use. Work and work Astronomer. Physical exam Your health care provider will check your: Height and weight. These may be used to calculate your BMI (body mass index). BMI is a measurement that tells if you are at a healthy weight. Waist circumference. This measures the distance around your waistline. This measurement also tells if you are at a healthy weight and may help predict your risk of certain diseases, such as type 2 diabetes and high blood pressure. Heart rate and blood pressure. Body temperature. Skin for abnormal spots. What immunizations do I need?  Vaccines are usually given at various ages, according to a schedule. Your health care provider will recommend vaccines for you based on your age, medical history, and lifestyle or other factors, such as travel or where you work. What tests do I need? Screening Your health care provider may recommend screening tests for certain conditions. This may include: Lipid and cholesterol levels. Diabetes screening. This is done by checking your blood sugar (glucose) after you have not eaten for a while (fasting). Hepatitis B test. Hepatitis C test. HIV (human immunodeficiency virus) test. STI (sexually transmitted infection) testing,  if you are at risk. Lung cancer screening. Prostate cancer screening. Colorectal cancer screening. Talk with your health care provider about your test results, treatment options, and if necessary, the need for more tests. Follow these instructions at home: Eating and drinking  Eat a diet that includes fresh fruits and vegetables, whole grains, lean protein, and low-fat dairy products. Take vitamin and mineral  supplements as recommended by your health care provider. Do not drink alcohol if your health care provider tells you not to drink. If you drink alcohol: Limit how much you have to 0-2 drinks a day. Know how much alcohol is in your drink. In the [place] one drink equals one 12 oz bottle of beer (355 mL), one 5 oz glass of wine (148 mL), or one 1 oz glass of hard liquor (44 mL). Lifestyle Brush your teeth every morning and night with fluoride toothpaste. Floss one time each day. Exercise for at least 30 minutes 5 or more days each week. Do not use any products that contain nicotine or tobacco. These products include cigarettes, chewing tobacco, and vaping devices, such as e-cigarettes. If you need help quitting, ask your health care provider. Do not use drugs. If you are sexually active, practice safe sex. Use a condom or other form of protection to prevent STIs. Take aspirin only as told by your health care provider. Make sure that you understand how much to take and what form to take. Work with your health care provider to find out whether it is safe and beneficial for you to take aspirin daily. Find healthy ways to manage stress, such as: Meditation, yoga, or listening to music. Journaling. Talking to a trusted person. Spending time with friends and family. Minimize exposure to UV radiation to reduce your risk of skin cancer. Safety Always wear your seat belt while driving or riding in a vehicle. Do not drive: If you have been drinking alcohol. Do not ride with someone who has been drinking. When you are tired or distracted. While texting. If you have been using any mind-altering substances or drugs. Wear a helmet and other protective equipment during sports activities. If you have firearms in your house, make sure you follow all gun safety procedures. What's next? Go to your health care provider once a year for an annual wellness visit. Ask your health care provider how often you should  have your eyes and teeth checked. Stay up to date on all vaccines. This information is not intended to replace advice given to you by your health care provider. Make sure you discuss any questions you have with your health care provider. Document Revised: 05/19/2021 Document Reviewed: 05/19/2021 Elsevier Patient Education  [employer].

## 2022-11-18 NOTE — Progress Notes (Signed)
Chief Complaint:  James Jimenez is a 56 y.o. male who presents today for his annual comprehensive physical exam.    Assessment/Plan:  Chronic Problems Addressed Today: Essential hypertension Blood pressure at goal.  He has been taking amlodipine 7.5 mg daily.  He has been tolerating well.  Will continue with his current dose.  Refill was sent in today.  He will continue to monitor at home and let us know if persistently elevated.  Reactive airway disease without complication Stable.  Uses albuterol very infrequently. Will refill today.  Lower urinary tract symptoms (LUTS) He is not currently having any urinary symptoms.  We did check PSA last year which was borderline elevated.  We referred him to urology however he did not follow-up with them.  Thankfully his urinary symptoms seem to have improved. We will recheck his PSA today.  If PSA is uptrending will need to be referred back to urology.  Family history of prostate cancer Check PSA.   Preventative Healthcare: Flu shot declined.  Will refer for colonoscopy.  Check labs.  Patient Counseling(The following topics were reviewed and/or handout was given):  -Nutrition: Stressed importance of moderation in sodium/caffeine intake, saturated fat and cholesterol, caloric balance, sufficient intake of fresh fruits, vegetables, and fiber.  -Stressed the importance of regular exercise.   -Substance Abuse: Discussed cessation/primary prevention of tobacco, alcohol, or other drug use; driving or other dangerous activities under the influence; availability of treatment for abuse.   -Injury prevention: Discussed safety belts, safety helmets, smoke detector, smoking near bedding or upholstery.   -Sexuality: Discussed sexually transmitted diseases, partner selection, use of condoms, avoidance of unintended pregnancy and contraceptive alternatives.   -Dental health: Discussed importance of regular tooth brushing, flossing, and dental visits.   -Health maintenance and immunizations reviewed. Please refer to Health maintenance section.  Return to care in 1 year for next preventative visit.     Subjective:  HPI:  He has no acute complaints today.   See A/P for status of chronic conditions.  At his last visit he was found to have mildly elevated PSA.  We did place referral to urology then.  Patient does state that he has some difficulty with scheduling, and has not yet followed up with them.  He was having some issues with frequent urination but seems to have resolved.  Is also been having issues with elevated blood pressure readings at home.  He taken upon himself to increase his dose of amlodipine to 7.5 mg daily.  Has been also working on diet and exercise modifications.  He has been tolerating the increased dose of amlodipine well without side effects.  Lifestyle Diet: Balanced. Cutting out snacking.  Exercise: Busy with work.      11/18/2022    1:18 PM  Depression screen PHQ 2/9  Decreased Interest 0  Down, Depressed, Hopeless 0  PHQ - 2 Score 0   Health Maintenance Due  Topic Date Due   HIV Screening  Never done   Hepatitis C Screening  Never done   COLONOSCOPY (Pts 45-59yrs Insurance coverage will need to be confirmed)  07/25/2022   Zoster Vaccines- Shingrix (2 of 2) 10/04/2022    ROS: Per HPI, otherwise a complete review of systems was negative.   PMH:  The following were reviewed and entered/updated in epic: Past Medical History:  Diagnosis Date   Asthma    reactive airway - uses inhaler    Hyperlipidemia    Hypertension    Left inguinal hernia  Patient Active Problem List   Diagnosis Date Noted   Shoulder contusion 04/02/2022   Dyslipidemia 11/11/2021   Hyperglycemia 11/11/2021   Lower urinary tract symptoms (LUTS) 11/10/2021   Erectile dysfunction 11/10/2021   Family history of prostate cancer 11/10/2021   Essential hypertension 09/14/2020   Reactive airway disease without complication  09/14/2020   Past Surgical History:  Procedure Laterality Date   CHOLECYSTECTOMY N/A 12/09/2020   Procedure: LAPAROSCOPIC CHOLECYSTECTOMY;  Surgeon: Quentin Ore, MD;  Location: [facility];  Service: General;  Laterality: N/A;   HERNIA REPAIR  1995 1991    Family History  Problem Relation Age of Onset   Hypertension Mother    Arthritis Mother    Cancer Mother    Hyperlipidemia Father    Prostate cancer Father    Hypertension Sister    Gallbladder disease Sister    Gallbladder disease Sister    Arthritis Maternal Grandmother    Hypertension Maternal Grandmother    Kidney disease Maternal Grandmother    Cancer Maternal Grandfather    Epilepsy Maternal Grandfather    Neuropathy Paternal Grandfather     Medications- reviewed and updated Current Outpatient Medications  Medication Sig Dispense Refill   albuterol (VENTOLIN HFA) 108 (90 Base) MCG/ACT inhaler Inhale 2 puffs into the lungs every 6 (six) hours as needed for wheezing or shortness of breath. 8 g 0   amLODipine (NORVASC) 5 MG tablet Take 1.5 tablets (7.5 mg total) by mouth daily. 135 tablet 3   No current facility-administered medications for this visit.    Allergies-reviewed and updated No Known Allergies  Social History   Socioeconomic History   Marital status: Married    Spouse name: Not on file   Number of children: Not on file   Years of education: Not on file   Highest education level: Not on file  Occupational History   Not on file  Tobacco Use   Smoking status: Never   Smokeless tobacco: Never  Vaping Use   Vaping Use: Never used  Substance and Sexual Activity   Alcohol use: Never   Drug use: Never   Sexual activity: Not on file  Other Topics Concern   Not on file  Social History Narrative   Not on file   Social Determinants of Health   Financial Resource Strain: Not on file  Food Insecurity: Not on file  Transportation Needs: Not on file  Physical Activity: Not on file  Stress: Not on  file  Social Connections: Not on file        Objective:  Physical Exam: BP 139/85   Pulse 62   Temp 98.4 F (36.9 C) (Temporal)   Ht 6\' 1"  (1.854 m)   Wt 236 lb 3.2 oz (107.1 kg)   SpO2 97%   BMI 31.16 kg/m   Body mass index is 31.16 kg/m. Wt Readings from Last 3 Encounters:  11/18/22 236 lb 3.2 oz (107.1 kg)  11/02/22 230 lb (104.3 kg)  03/30/22 243 lb (110.2 kg)   Gen: NAD, resting comfortably HEENT: TMs normal bilaterally. OP clear. No thyromegaly noted.  CV: RRR with no murmurs appreciated Pulm: NWOB, CTAB with no crackles, wheezes, or rhonchi GI: Normal bowel sounds present. Soft, Nontender, Nondistended. MSK: no edema, cyanosis, or clubbing noted Skin: warm, dry Neuro: CN2-12 grossly intact. Strength 5/5 in upper and lower extremities. Reflexes symmetric and intact bilaterally.  Psych: Normal affect and thought content     Lakeshia Dohner M. 04/01/22, MD 11/18/2022 2:00 PM

## 2022-11-18 NOTE — Assessment & Plan Note (Signed)
Blood pressure at goal.  He has been taking amlodipine 7.5 mg daily.  He has been tolerating well.  Will continue with his current dose.  Refill was sent in today.  He will continue to monitor at home and let us know if persistently elevated.

## 2022-11-22 ENCOUNTER — Encounter: Payer: Self-pay | Admitting: Family Medicine

## 2022-11-22 NOTE — Progress Notes (Signed)
Please inform patient of the following:  His PSA is back to a normal range.  We can recheck this again in his year.  His cholesterol levels and blood sugar are both borderline but stable compared to previous.  He would benefit from starting a cholesterol medication to improve his cholesterol numbers and lower risk of heart attack and stroke.  Please send in Lipitor 40 mg daily if he is agreeable to start.  Do not need to make any other changes to his treatment plan at this time.  He should continue to work on diet and exercise and we can recheck these in a year.

## 2022-11-23 ENCOUNTER — Other Ambulatory Visit: Payer: Self-pay | Admitting: *Deleted

## 2022-11-23 MED ORDER — AMLODIPINE BESYLATE 2.5 MG PO TABS: 2.5000 mg | ORAL_TABLET | Freq: Every day | ORAL | 0 refills | Status: DC

## 2022-11-23 MED ORDER — AMLODIPINE BESYLATE 5 MG PO TABS: 5.0000 mg | ORAL_TABLET | Freq: Every day | ORAL | 0 refills | Status: DC

## 2022-11-23 NOTE — Telephone Encounter (Signed)
Ok to send in two prescriptions. One for amlodipine 5mg  daily and another for 2.5mg  daily. He should take 7.5mg  daily total.  Akira Perusse M. , MD 11/23/2022 8:17 AM

## 2023-01-06 ENCOUNTER — Ambulatory Visit (INDEPENDENT_AMBULATORY_CARE_PROVIDER_SITE_OTHER): Payer: 59

## 2023-01-06 ENCOUNTER — Ambulatory Visit
Admission: EM | Admit: 2023-01-06 | Discharge: 2023-01-06 | Disposition: A | Discharge status: Home / Self Care | Payer: 59 | Attending: Family Medicine | Admitting: Family Medicine

## 2023-01-06 ENCOUNTER — Encounter: Payer: Self-pay | Admitting: Emergency Medicine

## 2023-01-06 DIAGNOSIS — R0789 Other chest pain: Secondary | ICD-10-CM

## 2023-01-06 DIAGNOSIS — R079 Chest pain, unspecified: Secondary | ICD-10-CM

## 2023-01-06 NOTE — ED Provider Notes (Signed)
[facility]    CSN: [id number] Arrival date & time: 01/06/23  1902      History   Chief Complaint Chief Complaint  Patient presents with   Chest Pain    HPI James Jimenez is a 57 y.o. male.   HPI  Past Medical History:  Diagnosis Date   Asthma    reactive airway - uses inhaler    Hyperlipidemia    Hypertension    Left inguinal hernia     Patient Active Problem List   Diagnosis Date Noted   Shoulder contusion 04/02/2022   Dyslipidemia 11/11/2021   Hyperglycemia 11/11/2021   Lower urinary tract symptoms (LUTS) 11/10/2021   Erectile dysfunction 11/10/2021   Family history of prostate cancer 11/10/2021   Essential hypertension 09/14/2020   Reactive airway disease without complication 51/76/1607    Past Surgical History:  Procedure Laterality Date   CHOLECYSTECTOMY N/A 12/09/2020   Procedure: LAPAROSCOPIC CHOLECYSTECTOMY;  Surgeon: Felicie Morn, MD;  Location: [facility];  Service: General;  Laterality: N/A;   [place] Medications    Prior to Admission medications   Medication Sig Start Date End Date Taking? Authorizing Provider  amLODipine (NORVASC) 2.5 MG tablet Take 1 tablet (2.5 mg total) by mouth daily. 11/23/22  Yes Vivi Barrack, MD  amLODipine (NORVASC) 5 MG tablet Take 1 tablet (5 mg total) by mouth daily. 11/23/22  Yes Vivi Barrack, MD  albuterol (VENTOLIN HFA) 108 (90 Base) MCG/ACT inhaler Inhale 2 puffs into the lungs every 6 (six) hours as needed for wheezing or shortness of breath. 11/18/22   Vivi Barrack, MD    Family History Family History  Problem Relation Age of Onset   Hypertension Mother    Arthritis Mother    Cancer Mother    Hyperlipidemia Father    Prostate cancer Father    Hypertension Sister    Gallbladder disease Sister    Gallbladder disease Sister    Arthritis Maternal Grandmother    Hypertension Maternal Grandmother    Kidney disease Maternal Grandmother    Cancer  Maternal Grandfather    Epilepsy Maternal Grandfather    Neuropathy Paternal Grandfather     Social History Social History   Tobacco Use   Smoking status: Never   Smokeless tobacco: Never  Vaping Use   Vaping Use: Never used  Substance Use Topics   Alcohol use: Never   Drug use: Never     Allergies   Patient has no known allergies.   Review of Systems Review of Systems   Physical Exam Triage Vital Signs ED Triage Vitals  Enc Vitals Group     BP 01/06/23 1927 (!) 159/97     Pulse Rate 01/06/23 1927 61     Resp 01/06/23 1927 16     Temp 01/06/23 1927 98.9 F (37.2 C)     Temp Source 01/06/23 1927 Oral     SpO2 01/06/23 1927 100 %     Weight --      Height --      Head Circumference --      Peak Flow --      Pain Score 01/06/23 1929 0     Pain Loc --      Pain Edu? --      Excl. in [place]? --    No data found.  Updated Vital Signs BP (!) 159/97 (BP Location: Left Arm)   Pulse 61  Temp 98.9 F (37.2 C) (Oral)   Resp 16   SpO2 100%   Visual Acuity Right Eye Distance:   Left Eye Distance:   Bilateral Distance:    Right Eye Near:   Left Eye Near:    Bilateral Near:     Physical Exam   UC Treatments / Results  Labs (all labs ordered are listed, but only abnormal results are displayed) Labs Reviewed - No data to display  EKG   Radiology DG Chest 2 View  Result Date: 01/06/2023 CLINICAL DATA:  Intermittent chest pain today EXAM: CHEST - 2 VIEW COMPARISON:  03/02/2022 FINDINGS: The heart size and mediastinal contours are within normal limits. Both lungs are clear. Disc degenerative disease of the thoracic spine. IMPRESSION: No acute abnormality of the lungs. Electronically Signed   By: Delanna Ahmadi M.D.   On: 01/06/2023 20:55    Procedures Procedures (including critical care time)  Medications Ordered in UC Medications - No data to display  Initial Impression / Assessment and Plan / UC Course  I have reviewed the triage vital signs and the  nursing notes.  Pertinent labs & imaging results that were available during my care of the patient were reviewed by me and considered in my medical decision making (see chart for details).    Etiology of chest pain not clear. ?GERD Recommend follow-up with PCP for persistence.  Final Clinical Impressions(s) / UC Diagnoses   Final diagnoses:  Atypical chest pain     Discharge Instructions      Monitor details of recurrent symptoms and record.  Consider a trial of medication such as Prilosec OTC (non-prescription omeprazole) for acid reflux.  If symptoms become significantly worse during the night or over the weekend, proceed to the local emergency room.     ED Prescriptions   None    PDMP not reviewed this encounter.

## 2023-01-06 NOTE — Discharge Instructions (Signed)
Monitor details of recurrent symptoms and record.  Consider a trial of medication such as Prilosec OTC (non-prescription omeprazole) for acid reflux.  If symptoms become significantly worse during the night or over the weekend, proceed to the local emergency room.

## 2023-01-06 NOTE — ED Triage Notes (Addendum)
Multiple episodes of a flushing, squeezing sensation that radiated up both arms and into neck. Towards the end of the afternoon it began to spread into his chest, with shorter time span between episodes. Reports some mild associated SOB, no palpitations. Each episode lasts around 5 minutes, states he's worried if they worsen it'll lead to near syncope/syncopal. Denies fever, N/V/D, syncope. Denies personal cardiac hx, although he reports his father recently had a TAVR procedure. CP is described as a substernal dull pressure, squeezing sensation. Episodes are spontaneous in nature, twice he was standing, twice sitting, once exercising, no known triggering or relieving factors.

## 2023-01-10 ENCOUNTER — Encounter: Payer: Self-pay | Admitting: Family Medicine

## 2023-01-23 ENCOUNTER — Ambulatory Visit (INDEPENDENT_AMBULATORY_CARE_PROVIDER_SITE_OTHER): Payer: 59 | Admitting: Family Medicine

## 2023-01-23 ENCOUNTER — Encounter: Payer: Self-pay | Admitting: Family Medicine

## 2023-01-23 VITALS — BP 133/87 | HR 68 | Temp 97.8°F | Ht 73.0 in | Wt 242.8 lb

## 2023-01-23 VITALS — BP 133/87 | HR 68 | Ht 73.0 in | Wt 242.0 lb

## 2023-01-23 DIAGNOSIS — M5010 Cervical disc disorder with radiculopathy, unspecified cervical region: Secondary | ICD-10-CM | POA: Diagnosis not present

## 2023-01-23 DIAGNOSIS — R079 Chest pain, unspecified: Secondary | ICD-10-CM

## 2023-01-23 DIAGNOSIS — I10 Essential (primary) hypertension: Secondary | ICD-10-CM

## 2023-01-23 DIAGNOSIS — M5412 Radiculopathy, cervical region: Secondary | ICD-10-CM
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
## 2023-01-23 NOTE — Assessment & Plan Note (Signed)
Blood pressure at goal.  Continue amlodipine 7.5 mg daily.  His home cuff is reading about 10 points higher than ours - he will adjust for this when monitoring at home.

## 2023-01-23 NOTE — Progress Notes (Signed)
   James Jimenez is a 57 y.o. male who presents today for an office visit.  Assessment/Plan:  New/Acute Problems: Cervical Radiculopathy Patient symptoms consistent with neuropathic type pain giving the paresthesias like description though is history is somewhat vague.  He does have positive Tinel sign at bilateral lateral wrist and medial epicondyle and may have some component of carpal tunnel or cubital tunnel syndrome playing a role as well.  He does have a known history of cervical spondylosis based on recent CT scan which is likely playing a role as well.  We discussed treatment options.  Will refer to sports medicine.  We discussed reasons to return to care.  Chronic Problems Addressed Today: No problem-specific Assessment & Plan notes found for this encounter.     Subjective:  HPI:  Patient here for urgent care follow-up.  Went to the urgent care 17 days ago with aching in his arms that radiated into his shoulders and neck.  This subsided and then had another episode with radiation into his mid chest.  At urgent care EKG was done which was stable.  Chest x-ray was negative. He was discharged home.  Patient describes that he has had multiple episodes of a tightness in his bilateral arms starting at his wrist and radiating into his chest. This happened 4 times the day he went to urgent care. Over the last couple of weeks, he has still having intermittent episodes. Recently this has been happening more into his upper arms. Most recently had an episode 4 days ago while at a hotel in [place] and again last night. Lasts for a few minutes then goes away. Feels like a tingling sensation. Feels like a "tiredness." He has not noticed any more symptoms radiating into his chest. No obvious aggravating or alleviating factors. No shortness of breath. No fevers or chills. He has had some popping when turning his neck in certain direction.        Objective:  Physical Exam: BP 133/87   Pulse 68    Temp 97.8 F (36.6 C) (Temporal)   Ht 6' 1"$  (1.854 m)   Wt 242 lb 12.8 oz (110.1 kg)   SpO2 96%   BMI 32.03 kg/m   Gen: No acute distress, resting comfortably CV: Regular rate and rhythm with no murmurs appreciated Pulm: Normal work of breathing, clear to auscultation bilaterally with no crackles, wheezes, or rhonchi MSK - Arms: Normal distal grip strength and thumb opposition.  Sensation light touch intact throughout.  Radial pulses 2+ and symmetric bilaterally.  Positive Tinel sign at bilateral wrist and medial epicondyle.  Spurling test negative bilaterally. Neuro: Grossly normal, moves all extremities Psych: Normal affect and thought content  Time Spent: 30 minutes of total time was spent on the date of the encounter performing the following actions: chart review prior to seeing the patient including recent UC visit, obtaining history, performing a medically necessary exam, counseling on the treatment plan, placing orders, and documenting in our EHR.        Algis Greenhouse. Jerline Pain, MD 01/23/2023 9:34 AM

## 2023-01-23 NOTE — Patient Instructions (Addendum)
Thank you for coming in today.   Please complete the exercises that the athletic trainer went over with you:  View at [email or web address] using code: [email or web address]  Next step would be MRI if needed.   I can also prescribe prednisone and gabapentin.   Will arrange for a home exercise program.   I can order PT. Let me know.   Keep me updated. I can concerned

## 2023-01-23 NOTE — Patient Instructions (Signed)
It was very nice to see you today!  I think you have inflammation in your nerves.  I will refer you to sports medicine.  Please let us know if your symptoms change before you can see them.  Take care, Dr Jerline Pain  PLEASE NOTE:  If you had any lab tests, please let us know if you have not heard back within a few days. You may see your results on mychart before we have a chance to review them but we will give you a call once they are reviewed by [place].   If we ordered any referrals today, please let us know if you have not heard from their office within the next week.   If you had any urgent prescriptions sent in today, please check with the pharmacy within an hour of our visit to make sure the prescription was transmitted appropriately.   Please try these tips to maintain a healthy lifestyle:  Eat at least 3 REAL meals and 1-2 snacks per day.  Aim for no more than 5 hours between eating.  If you eat breakfast, please do so within one hour of getting up.   Each meal should contain half fruits/vegetables, one quarter protein, and one quarter carbs (no bigger than a computer mouse)  Cut down on sweet beverages. This includes juice, soda, and sweet tea.   Drink at least 1 glass of water with each meal and aim for at least 8 glasses per day  Exercise at least 150 minutes every week.

## 2023-01-24 ENCOUNTER — Encounter: Payer: Self-pay | Admitting: Family Medicine

## 2023-01-24 NOTE — Telephone Encounter (Signed)
Ok with him monitoring at home. He is overall low risk. He should let us know if symptoms worsen and we can send in an antiviral.  Algis Greenhouse. Jerline Pain, MD 01/24/2023 3:20 PM

## 2023-01-24 NOTE — Telephone Encounter (Signed)
Spoke with patient no Sx at this time  Tested positive for Covid  Advise to take OTC Mucinex or Robitussin if needed for cough  If Sx worsen need OV, or VV UC in mychart

## 2023-01-31 ENCOUNTER — Encounter: Payer: Self-pay | Admitting: Family Medicine

## 2023-02-01 ENCOUNTER — Telehealth: Payer: Self-pay | Admitting: Gastroenterology

## 2023-02-01 NOTE — Telephone Encounter (Signed)
Good afternoon Dr. Candis Schatz,   Supervising Provider PM 02/01/23  We received a referral for this patient to have a screening colonoscopy. Patient has GI history in [place]. All records have been obtained for your review. Please advise on scheduling.   Thank you.

## 2023-02-02 NOTE — Telephone Encounter (Signed)
The patient underwent his initial screening colonoscopy July 25, 2017, performed by Dr. Carles Collet at the [facility] endoscopy center in [place]. The indication for the procedure was colon cancer screening. The bowel prep was reported as good and the colonoscopy was performed without difficulty.  3 polyps were found in the sigmoid colon (estimated size not given).  Polyps were removed with cold biopsy forceps.  Pathology report is provided and shows that all of the polyps were hyperplastic polyps. Hyperplastic polyps are not considered precancerous. I do not see any mention of a family history of colon cancer that would warrant more frequent colonoscopies.  He is considered average risk for colon cancer and would not need a colonoscopy for 10 years from his last one.  By current guidelines, the patient would not need his next screening colonoscopy until August 2028.

## 2023-02-07 ENCOUNTER — Encounter: Payer: Self-pay | Admitting: Family Medicine

## 2023-02-07 ENCOUNTER — Telehealth: Payer: Self-pay

## 2023-02-07 ENCOUNTER — Other Ambulatory Visit: Payer: Self-pay

## 2023-02-07 DIAGNOSIS — M5010 Cervical disc disorder with radiculopathy, unspecified cervical region: Secondary | ICD-10-CM

## 2023-02-07 NOTE — Telephone Encounter (Signed)
Pt accompanied his wife to her f/u visit w/ Dr. Georgina Snell. I discussed w/ him PT options and he agreed to give PT a try for a few weeks. Referral placed to [facility].

## 2023-02-08 NOTE — Telephone Encounter (Signed)
Looks like GI reviewed his pathology reports and saw that the polyps they removed were NOT precancerous. 10 year interval is the recommendation and it is fine for him to wait.  Algis Greenhouse. Jerline Pain, MD 02/08/2023 8:52 AM

## 2023-02-10 NOTE — Telephone Encounter (Signed)
Can we reach back out to GI and let them know he has a family history and needs a 5 year interval for screening?  James Jimenez. Jerline Pain, MD 02/10/2023 11:28 AM
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
## 2023-02-14 ENCOUNTER — Encounter: Payer: Self-pay | Admitting: Rehabilitative and Restorative Service Providers"

## 2023-02-14 ENCOUNTER — Other Ambulatory Visit: Payer: Self-pay

## 2023-02-14 ENCOUNTER — Ambulatory Visit: Payer: 59 | Attending: Family Medicine | Admitting: Rehabilitative and Restorative Service Providers"

## 2023-02-14 DIAGNOSIS — M5010 Cervical disc disorder with radiculopathy, unspecified cervical region: Secondary | ICD-10-CM | POA: Diagnosis not present

## 2023-02-14 DIAGNOSIS — R29898 Other symptoms and signs involving the musculoskeletal system: Secondary | ICD-10-CM | POA: Diagnosis not present

## 2023-02-14 DIAGNOSIS — M539 Dorsopathy, unspecified: Secondary | ICD-10-CM

## 2023-02-14 DIAGNOSIS — R293 Abnormal posture: Secondary | ICD-10-CM | POA: Diagnosis not present

## 2023-02-14 DIAGNOSIS — M5412 Radiculopathy, cervical region: Secondary | ICD-10-CM

## 2023-02-21 NOTE — Telephone Encounter (Signed)
Spoke with patient, patient notified results of previews colonoscope needed for referral to GI  Patient stated he may have records if unable to find them he will come to the office to sign medical records request

## 2023-02-22 ENCOUNTER — Ambulatory Visit: Payer: 59 | Admitting: Rehabilitative and Restorative Service Providers"

## 2023-02-27 ENCOUNTER — Other Ambulatory Visit: Payer: Self-pay | Admitting: Family Medicine

## 2023-02-27 MED ORDER — AMLODIPINE BESYLATE 2.5 MG PO TABS: 2.5000 mg | ORAL_TABLET | Freq: Every day | ORAL | 2 refills | Status: DC

## 2023-02-27 MED ORDER — AMLODIPINE BESYLATE 5 MG PO TABS: 5.0000 mg | ORAL_TABLET | Freq: Every day | ORAL | 2 refills | Status: DC

## 2023-03-01 ENCOUNTER — Ambulatory Visit: Payer: 59 | Admitting: Rehabilitative and Restorative Service Providers"

## 2023-03-01 ENCOUNTER — Encounter: Payer: Self-pay | Admitting: Rehabilitative and Restorative Service Providers"

## 2023-03-01 DIAGNOSIS — R29898 Other symptoms and signs involving the musculoskeletal system: Secondary | ICD-10-CM

## 2023-03-01 DIAGNOSIS — R293 Abnormal posture: Secondary | ICD-10-CM | POA: Diagnosis not present

## 2023-03-01 DIAGNOSIS — M5010 Cervical disc disorder with radiculopathy, unspecified cervical region: Secondary | ICD-10-CM | POA: Diagnosis not present

## 2023-03-01 DIAGNOSIS — M539 Dorsopathy, unspecified: Secondary | ICD-10-CM

## 2023-03-01 DIAGNOSIS — M5412 Radiculopathy, cervical region: Secondary | ICD-10-CM
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
## 2023-03-09 NOTE — Telephone Encounter (Signed)
Medical record received, placed in PCP office to be reviewed

## 2023-03-17 ENCOUNTER — Encounter: Payer: Self-pay | Admitting: Gastroenterology

## 2023-03-17 NOTE — Telephone Encounter (Signed)
Appointment schedule on Jun 20,2024 at 9:10 at [facility]  Office will send new patient forms  Patient notified

## 2023-03-20 ENCOUNTER — Encounter: Payer: Self-pay | Admitting: Family Medicine
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
## 2023-05-22 ENCOUNTER — Other Ambulatory Visit: Payer: Self-pay | Admitting: Family Medicine

## 2023-05-25 ENCOUNTER — Encounter: Payer: Self-pay | Admitting: Gastroenterology

## 2023-05-25 ENCOUNTER — Ambulatory Visit (INDEPENDENT_AMBULATORY_CARE_PROVIDER_SITE_OTHER): Payer: 59 | Admitting: Gastroenterology

## 2023-05-25 VITALS — BP 134/98 | HR 78 | Ht 73.0 in | Wt 247.0 lb

## 2023-05-25 DIAGNOSIS — Z86010 Personal history of colonic polyps: Secondary | ICD-10-CM

## 2023-05-25 DIAGNOSIS — Z83719 Family history of colon polyps, unspecified: Secondary | ICD-10-CM

## 2023-05-25 DIAGNOSIS — Z800 Family history of malignant neoplasm of digestive organs: Secondary | ICD-10-CM | POA: Diagnosis not present

## 2023-05-25 MED ORDER — NA SULFATE-K SULFATE-MG SULF 17.5-3.13-1.6 GM/177ML PO SOLN: 1.0000 | Freq: Once | ORAL | 0 refills | Status: AC

## 2023-05-25 NOTE — Patient Instructions (Signed)
_______________________________________________________  If your blood pressure at your visit was 140/90 or greater, please contact your primary care physician to follow up on this.  _______________________________________________________  If you are age 59 or older, your body mass index should be between 23-30. Your Body mass index is 32.59 kg/m. If this is out of the aforementioned range listed, please consider follow up with your Primary Care Provider.  If you are age 24 or younger, your body mass index should be between 19-25. Your Body mass index is 32.59 kg/m. If this is out of the aformentioned range listed, please consider follow up with your Primary Care Provider.   You have been scheduled for a colonoscopy. Please follow written instructions given to you at your visit today.  Please pick up your prep supplies at the pharmacy within the next 1-3 days. If you use inhalers (even only as needed), please bring them with you on the day of your procedure.   ________________________________________________________  The [facility] providers would like to encourage you to use [facility] to communicate with providers for non-urgent requests or questions.  Due to long hold times on the telephone, sending your provider a message by [facility] may be a faster and more efficient way to get a response.  Please allow 48 business hours for a response.  Please remember that this is for non-urgent requests.   It was a pleasure to see you today!  Thank you for trusting me with your gastrointestinal care!    Scott E.Tomasa Rand, MD

## 2023-05-25 NOTE — Progress Notes (Signed)
HPI : James Jimenez is a very pleasant 57 year old male with a history of HTN and HLP who is referred to [place] by Dr. Jacquiline Jimenez for consideration of repeat colonoscopy.  The patient underwent his initial screening colonoscopy in 2018 and was found to have 3 small sigmoid hyperplastic polyps.  Since then, his sisters have undergone colonoscopies and have both had precancerous polyps.  He does not know the details, but one sister is repeating colonoscopy in 3 year and one in 5 years.  His mother was diagnosed with a metastatic cancer of GI origin at age 77.  The patient denies any chronic GI symptoms to include abdominal pain, constipation, diarrhea or blood in his stool. He also denies any chronic upper GI symptoms to include frequent heartburn/acid regurgitation, nausea/vomiting, dysphagia, dyspepsia, or early satiety  He has no known cardiopulmonary comorbidities other than mild exertional asthma and denies any concerning symptoms such has chest pain/pressure, shortness of breath, light-headedness/dizziness/syncope, palpitations.    Past Medical History:  Diagnosis Date   Asthma    reactive airway - uses inhaler    Hyperlipidemia    Hypertension    Left inguinal hernia      Past Surgical History:  Procedure Laterality Date   CHOLECYSTECTOMY N/A 12/09/2020   Procedure: LAPAROSCOPIC CHOLECYSTECTOMY;  Surgeon: James Ore, MD;  Location: [facility];  Service: General;  Laterality: N/A;   HERNIA REPAIR  1995 1991   Family History  Problem Relation Age of Onset   Hypertension Mother    Arthritis Mother    Gastric cancer Mother    Hyperlipidemia Father    Prostate cancer Father    Hypertension Sister    Gallbladder disease Sister    Colon polyps Sister    Colon polyps Sister    Gallbladder disease Sister    Arthritis Maternal Grandmother    Hypertension Maternal Grandmother    Kidney disease Maternal Grandmother    Esophageal cancer Maternal Grandfather    Epilepsy  Maternal Grandfather    Neuropathy Paternal Grandfather    Colon cancer Neg Hx    Pancreatic cancer Neg Hx    Social History   Tobacco Use   Smoking status: Never   Smokeless tobacco: Never  Vaping Use   Vaping Use: Never used  Substance Use Topics   Alcohol use: Never   Drug use: Never   Current Outpatient Medications  Medication Sig Dispense Refill   albuterol (VENTOLIN HFA) 108 (90 Base) MCG/ACT inhaler Inhale 2 puffs into the lungs every 6 (six) hours as needed for wheezing or shortness of breath. 8 g 0   amLODipine (NORVASC) 2.5 MG tablet Take 1 tablet by mouth once daily 30 tablet 0   amLODipine (NORVASC) 5 MG tablet Take 1 tablet (5 mg total) by mouth daily. 30 tablet 2   No current facility-administered medications for this visit.   No Known Allergies   Review of Systems: All systems reviewed and negative except where noted in HPI.    No results found.  Physical Exam: BP (!) 158/98   Pulse 78   Ht 6\' 1"  (1.854 m)   Wt 247 lb (112 kg)   SpO2 98%   BMI 32.59 kg/m  Constitutional: Pleasant,well-developed, Caucasian male in no acute distress. HEENT: Normocephalic and atraumatic. Conjunctivae are normal. No scleral icterus. Neck supple.  Cardiovascular: Normal rate, regular rhythm.  Pulmonary/chest: Effort normal and breath sounds normal. No wheezing, rales or rhonchi. Abdominal: Soft, nondistended, nontender. Bowel sounds active throughout.  There are no masses palpable. No hepatomegaly. Extremities: no edema Lymphadenopathy: No cervical adenopathy noted. Neurological: Alert and oriented to person place and time. Skin: Skin is warm and dry. No rashes noted. Psychiatric: Normal mood and affect. Behavior is normal.  CBC    Component Value Date/Time   WBC 6.7 11/18/2022 1408   RBC 4.77 11/18/2022 1408   HGB 14.5 11/18/2022 1408   HCT 42.9 11/18/2022 1408   PLT 220.0 11/18/2022 1408   MCV 90.1 11/18/2022 1408   MCH 30.2 12/07/2020 0821   MCHC 33.7  11/18/2022 1408   RDW 13.0 11/18/2022 1408    CMP     Component Value Date/Time   NA 146 (H) 11/18/2022 1408   K 4.9 11/18/2022 1408   CL 107 11/18/2022 1408   CO2 29 11/18/2022 1408   GLUCOSE 90 11/18/2022 1408   BUN 20 11/18/2022 1408   CREATININE 1.31 11/18/2022 1408   CREATININE 1.39 (H) 09/14/2020 1444   CALCIUM 9.6 11/18/2022 1408   PROT 6.8 11/18/2022 1408   ALBUMIN 4.5 11/18/2022 1408   AST 17 11/18/2022 1408   ALT 18 11/18/2022 1408   ALKPHOS 64 11/18/2022 1408   BILITOT 0.5 11/18/2022 1408   GFRNONAA >60 12/07/2020 0821       Latest Ref Rng & Units 11/18/2022    2:08 PM 11/10/2021   10:00 AM 12/07/2020    8:21 AM  CBC EXTENDED  WBC 4.0 - 10.5 K/uL 6.7  6.2  5.4   RBC 4.22 - 5.81 Mil/uL 4.77  4.91  5.04   Hemoglobin 13.0 - 17.0 g/dL 16.1  09.6  04.5   HCT 39.0 - 52.0 % 42.9  43.9  45.6   Platelets 150.0 - 400.0 K/uL 220.0  215.0  209       ASSESSMENT AND PLAN: 57 year old male with a family history of colon polyps and suspected metastatic colon cancer, with 1 previous colonoscopy 6 years ago with 3 small hyperplastic polyps.  Although the details of his sisters polyps are not known, he does know one of his sisters was recommended to repeat colonoscopy in 3 years.  This likely indicates that she either had numerous polyps or had an advanced polyp.  As his sister was younger than 80 years of age, it would be reasonable to recommend he undergo more intensive colon cancer screening every 5 years.  The patient desires to undergo colonoscopy.  We will schedule him for a high risk screening colonoscopy.  Family history of polyps/cancer - Colonoscopy  The details, risks (including bleeding, perforation, infection, missed lesions, medication reactions and possible hospitalization or surgery if complications occur), benefits, and alternatives to colonoscopy with possible biopsy and possible polypectomy were discussed with the patient and he consents to proceed.   James Jimenez E.  James Rand, MD [facility]  CC:  James Dark, MD

## 2023-06-21 ENCOUNTER — Other Ambulatory Visit: Payer: Self-pay | Admitting: Family Medicine

## 2023-06-26 ENCOUNTER — Encounter: Payer: Self-pay | Admitting: Family Medicine

## 2023-06-27 ENCOUNTER — Other Ambulatory Visit: Payer: Self-pay

## 2023-06-27 ENCOUNTER — Telehealth: Payer: Self-pay | Admitting: *Deleted

## 2023-06-27 MED ORDER — AMLODIPINE BESYLATE 10 MG PO TABS: 10.0000 mg | ORAL_TABLET | Freq: Every day | ORAL | 3 refills | Status: DC

## 2023-06-27 NOTE — Telephone Encounter (Signed)
I connected with James Jimenez on 7/23 at 1203 by telephone and verified that I am speaking with the correct person using two identifiers. According to the patient's chart they are due for physical with [facility]. Pt scheduled. There are no transportation issues at this time. Nothing further was needed at the end of our conversation.

## 2023-06-27 NOTE — Telephone Encounter (Signed)
I appreciate the update. Ok for him to go to norvasc 10 mg daily. Please send in new prescription if needed.   I would like to see him back in the office in 1-2 weeks.  [name]. [name], MD 06/27/2023 7:59 AM

## 2023-07-14 ENCOUNTER — Encounter: Payer: 59 | Admitting: Gastroenterology

## 2023-08-18 ENCOUNTER — Encounter: Payer: Self-pay | Admitting: Gastroenterology

## 2023-09-01 ENCOUNTER — Encounter: Payer: 59 | Admitting: Gastroenterology

## 2023-09-15 DIAGNOSIS — Z6830 Body mass index (BMI) 30.0-30.9, adult: Secondary | ICD-10-CM | POA: Diagnosis not present

## 2023-09-15 DIAGNOSIS — Z809 Family history of malignant neoplasm, unspecified: Secondary | ICD-10-CM | POA: Diagnosis not present

## 2023-09-15 DIAGNOSIS — I10 Essential (primary) hypertension: Secondary | ICD-10-CM | POA: Diagnosis not present

## 2023-09-15 DIAGNOSIS — J45909 Unspecified asthma, uncomplicated: Secondary | ICD-10-CM | POA: Diagnosis not present

## 2023-09-15 DIAGNOSIS — M1990 Unspecified osteoarthritis, unspecified site: Secondary | ICD-10-CM | POA: Diagnosis not present

## 2023-09-15 DIAGNOSIS — E669 Obesity, unspecified: Secondary | ICD-10-CM | POA: Diagnosis not present

## 2023-09-15 DIAGNOSIS — Z8249 Family history of ischemic heart disease and other diseases of the circulatory system: Secondary | ICD-10-CM | POA: Diagnosis not present

## 2023-10-25 ENCOUNTER — Ambulatory Visit (AMBULATORY_SURGERY_CENTER): Payer: 59

## 2023-10-25 VITALS — Ht 73.0 in | Wt 230.0 lb

## 2023-10-25 DIAGNOSIS — Z83719 Family history of colon polyps, unspecified: Secondary | ICD-10-CM

## 2023-10-25 NOTE — Progress Notes (Signed)

## 2023-10-26 ENCOUNTER — Encounter: Payer: Self-pay | Admitting: Gastroenterology

## 2023-11-01 ENCOUNTER — Encounter: Payer: Self-pay | Admitting: Gastroenterology

## 2023-11-10 ENCOUNTER — Encounter: Payer: 59 | Admitting: Gastroenterology

## 2023-11-23 ENCOUNTER — Encounter: Payer: Self-pay | Admitting: Family Medicine

## 2023-11-23 ENCOUNTER — Ambulatory Visit (INDEPENDENT_AMBULATORY_CARE_PROVIDER_SITE_OTHER): Payer: 59 | Admitting: Family Medicine

## 2023-11-23 VITALS — BP 131/83 | HR 61 | Temp 97.5°F | Ht 73.0 in | Wt 239.0 lb

## 2023-11-23 DIAGNOSIS — Z0001 Encounter for general adult medical examination with abnormal findings: Secondary | ICD-10-CM | POA: Diagnosis not present

## 2023-11-23 DIAGNOSIS — R399 Unspecified symptoms and signs involving the genitourinary system: Secondary | ICD-10-CM

## 2023-11-23 DIAGNOSIS — I10 Essential (primary) hypertension: Secondary | ICD-10-CM | POA: Diagnosis not present

## 2023-11-23 DIAGNOSIS — Z8042 Family history of malignant neoplasm of prostate: Secondary | ICD-10-CM

## 2023-11-23 DIAGNOSIS — R739 Hyperglycemia, unspecified: Secondary | ICD-10-CM | POA: Diagnosis not present

## 2023-11-23 DIAGNOSIS — J45909 Unspecified asthma, uncomplicated: Secondary | ICD-10-CM | POA: Diagnosis not present

## 2023-11-23 DIAGNOSIS — Z23 Encounter for immunization: Secondary | ICD-10-CM | POA: Diagnosis not present

## 2023-11-23 DIAGNOSIS — E785 Hyperlipidemia, unspecified: Secondary | ICD-10-CM

## 2023-11-23 LAB — CBC
HCT: 42.6 % (ref 39.0–52.0)
Hemoglobin: 14.2 g/dL (ref 13.0–17.0)
MCHC: 33.4 g/dL (ref 30.0–36.0)
MCV: 90.6 fL (ref 78.0–100.0)
Platelets: 223 10*3/uL (ref 150.0–400.0)
RBC: 4.71 Mil/uL (ref 4.22–5.81)
RDW: 13.5 % (ref 11.5–15.5)
WBC: 5.5 10*3/uL (ref 4.0–10.5)

## 2023-11-23 LAB — COMPREHENSIVE METABOLIC PANEL
ALT: 19 U/L (ref 0–53)
AST: 17 U/L (ref 0–37)
Albumin: 4.3 g/dL (ref 3.5–5.2)
Alkaline Phosphatase: 62 U/L (ref 39–117)
BUN: 19 mg/dL (ref 6–23)
CO2: 26 meq/L (ref 19–32)
Calcium: 9.3 mg/dL (ref 8.4–10.5)
Chloride: 106 meq/L (ref 96–112)
Creatinine, Ser: 1.11 mg/dL (ref 0.40–1.50)
GFR: 72.87 mL/min (ref >60.00)
Glucose, Bld: 94 mg/dL (ref 70–99)
Potassium: 4 meq/L (ref 3.5–5.1)
Sodium: 139 meq/L (ref 135–145)
Total Bilirubin: 0.5 mg/dL (ref 0.2–1.2)
Total Protein: 6.5 g/dL (ref 6.0–8.3)

## 2023-11-23 LAB — LIPID PANEL
Cholesterol: 165 mg/dL (ref 0–200)
HDL: 35.1 mg/dL — ABNORMAL LOW (ref >39.00)
LDL Cholesterol: 97 mg/dL (ref 0–99)
NonHDL: 129.94
Total CHOL/HDL Ratio: 5
Triglycerides: 165 mg/dL — ABNORMAL HIGH (ref 0.0–149.0)
VLDL: 33 mg/dL (ref 0.0–40.0)

## 2023-11-23 LAB — TSH: TSH: 1.62 u[IU]/mL (ref 0.35–5.50)

## 2023-11-23 LAB — PSA: PSA: 4.31 ng/mL — ABNORMAL HIGH (ref 0.10–4.00)

## 2023-11-23 LAB — HEMOGLOBIN A1C: Hgb A1c MFr Bld: 5.9 % (ref 4.6–6.5)

## 2023-11-23 NOTE — Patient Instructions (Signed)
It was very nice to see you today!  Will check blood work today.  Please monitor your blood pressure and let us know if it is persistently elevated.  Let us know if you have any worsening reactive airway symptoms.  Please continue to work on diet and exercise.  I will see back in year for your next physical.  Come back sooner if needed.  Return in about 1 year (around 11/22/2024) for Annual Physical.   Take care, Dr [name]  PLEASE NOTE:  If you had any lab tests, please let us know if you have not heard back within a few days. You may see your results on mychart before we have a chance to review them but we will give you a call once they are reviewed by [place].   If we ordered any referrals today, please let us know if you have not heard from their office within the next week.   If you had any urgent prescriptions sent in today, please check with the pharmacy within an hour of our visit to make sure the prescription was transmitted appropriately.   Please try these tips to maintain a healthy lifestyle:  Eat at least 3 REAL meals and 1-2 snacks per day.  Aim for no more than 5 hours between eating.  If you eat breakfast, please do so within one hour of getting up.   Each meal should contain half fruits/vegetables, one quarter protein, and one quarter carbs (no bigger than a computer mouse)  Cut down on sweet beverages. This includes juice, soda, and sweet tea.   Drink at least 1 glass of water with each meal and aim for at least 8 glasses per day  Exercise at least 150 minutes every week.    Preventive Care 20-55 Years Old, Male Preventive care refers to lifestyle choices and visits with your health care provider that can promote health and wellness. Preventive care visits are also called wellness exams. What can I expect for my preventive care visit? Counseling During your preventive care visit, your health care provider may ask about your: Medical history, including: Past medical  problems. Family medical history. Current health, including: Emotional well-being. Home life and relationship well-being. Sexual activity. Lifestyle, including: Alcohol, nicotine or tobacco, and drug use. Access to firearms. Diet, exercise, and sleep habits. Safety issues such as seatbelt and bike helmet use. Sunscreen use. Work and work Astronomer. Physical exam Your health care provider will check your: Height and weight. These may be used to calculate your BMI (body mass index). BMI is a measurement that tells if you are at a healthy weight. Waist circumference. This measures the distance around your waistline. This measurement also tells if you are at a healthy weight and may help predict your risk of certain diseases, such as type 2 diabetes and high blood pressure. Heart rate and blood pressure. Body temperature. Skin for abnormal spots. What immunizations do I need?  Vaccines are usually given at various ages, according to a schedule. Your health care provider will recommend vaccines for you based on your age, medical history, and lifestyle or other factors, such as travel or where you work. What tests do I need? Screening Your health care provider may recommend screening tests for certain conditions. This may include: Lipid and cholesterol levels. Diabetes screening. This is done by checking your blood sugar (glucose) after you have not eaten for a while (fasting). Hepatitis B test. Hepatitis C test. HIV (human immunodeficiency virus) test. STI (sexually transmitted  infection) testing, if you are at risk. Lung cancer screening. Prostate cancer screening. Colorectal cancer screening. Talk with your health care provider about your test results, treatment options, and if necessary, the need for more tests. Follow these instructions at home: Eating and drinking  Eat a diet that includes fresh fruits and vegetables, whole grains, lean protein, and low-fat dairy  products. Take vitamin and mineral supplements as recommended by your health care provider. Do not drink alcohol if your health care provider tells you not to drink. If you drink alcohol: Limit how much you have to 0-2 drinks a day. Know how much alcohol is in your drink. In the [place] one drink equals one 12 oz bottle of beer (355 mL), one 5 oz glass of wine (148 mL), or one 1 oz glass of hard liquor (44 mL). Lifestyle Brush your teeth every morning and night with fluoride toothpaste. Floss one time each day. Exercise for at least 30 minutes 5 or more days each week. Do not use any products that contain nicotine or tobacco. These products include cigarettes, chewing tobacco, and vaping devices, such as e-cigarettes. If you need help quitting, ask your health care provider. Do not use drugs. If you are sexually active, practice safe sex. Use a condom or other form of protection to prevent STIs. Take aspirin only as told by your health care provider. Make sure that you understand how much to take and what form to take. Work with your health care provider to find out whether it is safe and beneficial for you to take aspirin daily. Find healthy ways to manage stress, such as: Meditation, yoga, or listening to music. Journaling. Talking to a trusted person. Spending time with friends and family. Minimize exposure to UV radiation to reduce your risk of skin cancer. Safety Always wear your seat belt while driving or riding in a vehicle. Do not drive: If you have been drinking alcohol. Do not ride with someone who has been drinking. When you are tired or distracted. While texting. If you have been using any mind-altering substances or drugs. Wear a helmet and other protective equipment during sports activities. If you have firearms in your house, make sure you follow all gun safety procedures. What's next? Go to your health care provider once a year for an annual wellness visit. Ask your  health care provider how often you should have your eyes and teeth checked. Stay up to date on all vaccines. This information is not intended to replace advice given to you by your health care provider. Make sure you discuss any questions you have with your health care provider. Document Revised: 05/19/2021 Document Reviewed: 05/19/2021 Elsevier Patient Education  2024 [employer].

## 2023-11-23 NOTE — Assessment & Plan Note (Signed)
Normal exam today.  Uses albuterol as needed.  Did have a flare while in [place] off amlodipine.  Discussed with patient that this would be an atypical reaction due to stopping amlodipine however may be possible.  He will let us know if he has any recurrent flareups.

## 2023-11-23 NOTE — Progress Notes (Signed)
Chief Complaint:  James Jimenez is a 57 y.o. male who presents today for his annual comprehensive physical exam.    Assessment/Plan:  Chronic Problems Addressed Today: Hyperglycemia Discussed lifestyle modifications.  Check A1c.  Dyslipidemia Check lipids  Reactive airway disease without complication Normal exam today.  Uses albuterol as needed.  Did have a flare while in [place] off amlodipine.  Discussed with patient that this would be an atypical reaction due to stopping amlodipine however may be possible.  He will let us know if he has any recurrent flareups.  Family history of prostate cancer Check PSA.  Essential hypertension Initially mildly elevated at 143/83 today.  At goal on recheck.  Continue amlodipine 10 mg daily.  He will monitor at home and let us know if persistently elevated.   Preventative Healthcare: Check labs. Due for colonoscopy.  Up-to-date on shingles.  He will check about tetanus vaccine.  Patient Counseling(The following topics were reviewed and/or handout was given):  -Nutrition: Stressed importance of moderation in sodium/caffeine intake, saturated fat and cholesterol, caloric balance, sufficient intake of fresh fruits, vegetables, and fiber.  -Stressed the importance of regular exercise.   -Substance Abuse: Discussed cessation/primary prevention of tobacco, alcohol, or other drug use; driving or other dangerous activities under the influence; availability of treatment for abuse.   -Injury prevention: Discussed safety belts, safety helmets, smoke detector, smoking near bedding or upholstery.   -Sexuality: Discussed sexually transmitted diseases, partner selection, use of condoms, avoidance of unintended pregnancy and contraceptive alternatives.   -Dental health: Discussed importance of regular tooth brushing, flossing, and dental visits.  -Health maintenance and immunizations reviewed. Please refer to Health maintenance section.  Return to care  in 1 year for next preventative visit.     Subjective:  HPI:  He has no acute complaints today. See Assessment / plan for status of chronic conditions. Since our last visit he did have a flare of his reactive airway disease while in [place]. He attributes this to stopping his amlodipine due to forgetting it while traveling.  He was able to get this refilled and had immediate improvement in his symptoms.  He has done well since then.  This happened a few months ago.  We also did increase his amlodipine to 10 mg daily about 5 months ago due to elevated home readings.  He has done well with a higher dose.  Lifestyle Diet: Balanced.  Exercise: Power Yoga.      11/23/2023    9:30 AM  Depression screen PHQ 2/9  Decreased Interest 0  Down, Depressed, Hopeless 0  PHQ - 2 Score 0    Health Maintenance Due  Topic Date Due   DTaP/Tdap/Td  Never done   Colonoscopy  07/25/2022     ROS: Per HPI, otherwise a complete review of systems was negative.   PMH:  The following were reviewed and entered/updated in epic: Past Medical History:  Diagnosis Date   Allergy    Asthma    reactive airway - uses inhaler    Hyperlipidemia    Hypertension    Left inguinal hernia    Patient Active Problem List   Diagnosis Date Noted   Shoulder contusion 04/02/2022   Dyslipidemia 11/11/2021   Hyperglycemia 11/11/2021   Lower urinary tract symptoms (LUTS) 11/10/2021   Erectile dysfunction 11/10/2021   Family history of prostate cancer 11/10/2021   Essential hypertension 09/14/2020   Reactive airway disease without complication 09/14/2020   Past Surgical History:  Procedure Laterality Date  CHOLECYSTECTOMY N/A 12/09/2020   Procedure: LAPAROSCOPIC CHOLECYSTECTOMY;  Surgeon: Quentin Ore, MD;  Location: [facility];  Service: General;  Laterality: N/A;   HERNIA REPAIR  1995 1991    Family History  Problem Relation Age of Onset   Hypertension Mother    Arthritis Mother    Gastric cancer  Mother    Hyperlipidemia Father    Prostate cancer Father    Hypertension Sister    Gallbladder disease Sister    Colon polyps Sister    Colon polyps Sister    Gallbladder disease Sister    Arthritis Maternal Grandmother    Hypertension Maternal Grandmother    Kidney disease Maternal Grandmother    Esophageal cancer Maternal Grandfather    Epilepsy Maternal Grandfather    Neuropathy Paternal Grandfather    Colon cancer Neg Hx    Pancreatic cancer Neg Hx    Rectal cancer Neg Hx    Stomach cancer Neg Hx     Medications- reviewed and updated Current Outpatient Medications  Medication Sig Dispense Refill   albuterol (VENTOLIN HFA) 108 (90 Base) MCG/ACT inhaler Inhale 2 puffs into the lungs every 6 (six) hours as needed for wheezing or shortness of breath. 8 g 0   amLODipine (NORVASC) 10 MG tablet Take 1 tablet (10 mg total) by mouth daily. 90 tablet 3   No current facility-administered medications for this visit.    Allergies-reviewed and updated No Known Allergies  Social History   Socioeconomic History   Marital status: Married    Spouse name: Not on file   Number of children: Not on file   Years of education: Not on file   Highest education level: Not on file  Occupational History   Not on file  Tobacco Use   Smoking status: Never   Smokeless tobacco: Never  Vaping Use   Vaping status: Never Used  Substance and Sexual Activity   Alcohol use: Never   Drug use: Never   Sexual activity: Not on file  Other Topics Concern   Not on file  Social History Narrative   Not on file   Social Drivers of Health   Financial Resource Strain: Not on file  Food Insecurity: Not on file  Transportation Needs: No Transportation Needs (06/27/2023)   PRAPARE - Transportation    Lack of Transportation (Medical): No    Lack of Transportation (Non-Medical): No  Physical Activity: Not on file  Stress: Not on file  Social Connections: Not on file        Objective:  Physical  Exam: BP 131/83   Pulse 61   Temp (!) 97.5 F (36.4 C) (Temporal)   Ht 6\' 1"  (1.854 m)   Wt 239 lb (108.4 kg)   SpO2 98%   BMI 31.53 kg/m   Body mass index is 31.53 kg/m. Wt Readings from Last 3 Encounters:  11/23/23 239 lb (108.4 kg)  10/25/23 230 lb (104.3 kg)  05/25/23 247 lb (112 kg)   Gen: NAD, resting comfortably HEENT: TMs normal bilaterally. OP clear. No thyromegaly noted.  CV: RRR with no murmurs appreciated Pulm: NWOB, CTAB with no crackles, wheezes, or rhonchi GI: Normal bowel sounds present. Soft, Nontender, Nondistended. MSK: no edema, cyanosis, or clubbing noted Skin: warm, dry Neuro: CN2-12 grossly intact. Strength 5/5 in upper and lower extremities. Reflexes symmetric and intact bilaterally.  Psych: Normal affect and thought content     Graydon Fofana M. [name], MD 11/23/2023 10:28 AM

## 2023-11-23 NOTE — Assessment & Plan Note (Signed)
Check lipids 

## 2023-11-23 NOTE — Assessment & Plan Note (Signed)
Check PSA. ?

## 2023-11-23 NOTE — Assessment & Plan Note (Signed)
Discussed lifestyle modifications. Check A1c.

## 2023-11-23 NOTE — Assessment & Plan Note (Addendum)
Initially mildly elevated at 143/83 today.  At goal on recheck.  Continue amlodipine 10 mg daily.  He will monitor at home and let us know if persistently elevated.

## 2023-11-24 NOTE — Progress Notes (Signed)
PSA is up into the borderline range.  Due to family history it would be a good idea for him to see urology for this.  Please place referral if needed.  His good cholesterol is little bit low but his bad cholesterol is at goal.  A1c is borderline but stable.  Do not need to make any changes at this time.  He should continue to work on diet and exercise and we can recheck in a year.

## 2023-11-28 ENCOUNTER — Other Ambulatory Visit: Payer: Self-pay | Admitting: *Deleted

## 2023-11-28 DIAGNOSIS — R972 Elevated prostate specific antigen [PSA]: Secondary | ICD-10-CM

## 2023-12-12 ENCOUNTER — Ambulatory Visit (AMBULATORY_SURGERY_CENTER): Payer: 59

## 2023-12-12 VITALS — Ht 73.0 in | Wt 230.0 lb

## 2023-12-12 DIAGNOSIS — Z800 Family history of malignant neoplasm of digestive organs: Secondary | ICD-10-CM

## 2023-12-12 DIAGNOSIS — Z860100 Personal history of colon polyps, unspecified: Secondary | ICD-10-CM

## 2023-12-12 NOTE — Progress Notes (Signed)

## 2023-12-28 ENCOUNTER — Ambulatory Visit: Payer: 59 | Admitting: Urology

## 2023-12-28 ENCOUNTER — Encounter: Payer: Self-pay | Admitting: Urology

## 2023-12-28 VITALS — BP 157/94 | HR 60 | Ht 73.0 in | Wt 235.0 lb

## 2023-12-28 DIAGNOSIS — R972 Elevated prostate specific antigen [PSA]: Secondary | ICD-10-CM | POA: Diagnosis not present

## 2023-12-28 DIAGNOSIS — Z8042 Family history of malignant neoplasm of prostate: Secondary | ICD-10-CM

## 2023-12-28 NOTE — Progress Notes (Signed)
Assessment: 1. Elevated PSA   2. Family history of prostate cancer      Plan: I personally reviewed the patient's chart including provider notes, and lab results. Today I had a long discussion with the patient regarding PSA and the rationale and controversies of prostate cancer early detection.  I discussed the pros and cons of further evaluation including TRUS and prostate Bx.  Potential adverse events and complications as well as standard instructions were given.  Patient expressed his understanding of these issues. I also discussed the role of additional blood test, urine biomarkers, and MRI in the diagnosis of prostate cancer. At this point he would like to consider repeating a PSA in approximately 3 months. Recommend 4K test in 2 months for reevaluation.  Chief Complaint:  Chief Complaint  Patient presents with   Elevated PSA    History of Present Illness:  James Jimenez is a 58 y.o. male who is seen in consultation from Ardith Dark, MD for evaluation of elevated PSA.  PSA results: 10/21 2.88 12/22 4.81 12/23 3.40 12/24 4.31  He has a family history of prostate cancer with his father.  He was treated with brachytherapy.  No history of UTIs or prostatitis.  No prior urologic evaluation. He does report some urinary symptoms including frequency and nocturia x 2.  He does associate the frequency with his fluid intake.  No dysuria or gross hematuria.  Past Medical History:  Past Medical History:  Diagnosis Date   Allergy    Asthma    reactive airway - uses inhaler    Hyperlipidemia    Hypertension    Left inguinal hernia     Past Surgical History:  Past Surgical History:  Procedure Laterality Date   CHOLECYSTECTOMY N/A 12/09/2020   Procedure: LAPAROSCOPIC CHOLECYSTECTOMY;  Surgeon: Quentin Ore, MD;  Location: [facility];  Service: General;  Laterality: N/A;   HERNIA REPAIR  1995 1991    Allergies:  No Known Allergies  Family History:  Family History   Problem Relation Age of Onset   Hypertension Mother    Arthritis Mother    Gastric cancer Mother    Hyperlipidemia Father    Prostate cancer Father    Hypertension Sister    Gallbladder disease Sister    Colon polyps Sister    Colon polyps Sister    Gallbladder disease Sister    Arthritis Maternal Grandmother    Hypertension Maternal Grandmother    Kidney disease Maternal Grandmother    Esophageal cancer Maternal Grandfather    Epilepsy Maternal Grandfather    Neuropathy Paternal Grandfather    Colon cancer Neg Hx    Pancreatic cancer Neg Hx    Rectal cancer Neg Hx    Stomach cancer Neg Hx     Social History:  Social History   Tobacco Use   Smoking status: Never   Smokeless tobacco: Never  Vaping Use   Vaping status: Never Used  Substance Use Topics   Alcohol use: Never   Drug use: Never    Review of symptoms:  Constitutional:  Negative for unexplained weight loss, night sweats, fever, chills ENT:  Negative for nose bleeds, sinus pain, painful swallowing CV:  Negative for chest pain, shortness of breath, exercise intolerance, palpitations, loss of consciousness Resp:  Negative for cough, wheezing, shortness of breath GI:  Negative for nausea, vomiting, diarrhea, bloody stools GU:  Positives noted in HPI; otherwise negative for gross hematuria, dysuria, urinary incontinence Neuro:  Negative for seizures, poor  balance, limb weakness, slurred speech Psych:  Negative for lack of energy, depression, anxiety Endocrine:  Negative for polydipsia, polyuria, symptoms of hypoglycemia (dizziness, hunger, sweating) Hematologic:  Negative for anemia, purpura, petechia, prolonged or excessive bleeding, use of anticoagulants  Allergic:  Negative for difficulty breathing or choking as a result of exposure to anything; no shellfish allergy; no allergic response (rash/itch) to materials, foods  Physical exam: BP (!) 157/94   Pulse 60   Ht 6\' 1"  (1.854 m)   Wt 235 lb (106.6 kg)    BMI 31.00 kg/m  GENERAL APPEARANCE:  Well appearing, well developed, well nourished, NAD HEENT: Atraumatic, Normocephalic, oropharynx clear. NECK: Supple without lymphadenopathy or thyromegaly. LUNGS: Clear to auscultation bilaterally. HEART: Regular Rate and Rhythm without murmurs, gallops, or rubs. ABDOMEN: Soft, non-tender, No Masses. EXTREMITIES: Moves all extremities well.  Without clubbing, cyanosis, or edema. NEUROLOGIC:  Alert and oriented x 3, normal gait, CN II-XII grossly intact.  MENTAL STATUS:  Appropriate. BACK:  Non-tender to palpation.  No CVAT SKIN:  Warm, dry and intact.  GU: Penis:  circumcised Meatus: Normal Scrotum: normal, no masses Testis: normal without masses bilateral Prostate: 40 g, NT, no nodules Rectum: Normal tone,  no masses or tenderness    Results: U/A: negative

## 2023-12-29 ENCOUNTER — Encounter: Payer: Self-pay | Admitting: Gastroenterology

## 2024-01-01 LAB — URINALYSIS, ROUTINE W REFLEX MICROSCOPIC
Bilirubin, UA: NEGATIVE
Glucose, UA: NEGATIVE
Ketones, UA: NEGATIVE
Leukocytes,UA: NEGATIVE
Nitrite, UA: NEGATIVE
Protein,UA: NEGATIVE
RBC, UA: NEGATIVE
Specific Gravity, UA: <1.005 — ABNORMAL LOW (ref 1.005–1.030)
Urobilinogen, Ur: 0.2 mg/dL (ref 0.2–1.0)
pH, UA: 6 (ref 5.0–7.5)

## 2024-01-03 ENCOUNTER — Ambulatory Visit (AMBULATORY_SURGERY_CENTER): Payer: 59 | Admitting: Gastroenterology

## 2024-01-03 ENCOUNTER — Encounter: Payer: Self-pay | Admitting: Gastroenterology

## 2024-01-03 VITALS — BP 108/75 | HR 54 | Temp 98.4°F | Resp 17 | Ht 73.0 in | Wt 230.0 lb

## 2024-01-03 DIAGNOSIS — J45909 Unspecified asthma, uncomplicated: Secondary | ICD-10-CM | POA: Diagnosis not present

## 2024-01-03 DIAGNOSIS — I10 Essential (primary) hypertension: Secondary | ICD-10-CM | POA: Diagnosis not present

## 2024-01-03 DIAGNOSIS — Z860100 Personal history of colon polyps, unspecified: Secondary | ICD-10-CM | POA: Diagnosis not present

## 2024-01-03 DIAGNOSIS — Z860102 Personal history of hyperplastic colon polyps: Secondary | ICD-10-CM | POA: Diagnosis not present

## 2024-01-03 DIAGNOSIS — K5730 Diverticulosis of large intestine without perforation or abscess without bleeding: Secondary | ICD-10-CM | POA: Diagnosis not present

## 2024-01-03 DIAGNOSIS — Z83719 Family history of colon polyps, unspecified: Secondary | ICD-10-CM | POA: Diagnosis not present

## 2024-01-03 DIAGNOSIS — D120 Benign neoplasm of cecum: Secondary | ICD-10-CM

## 2024-01-03 DIAGNOSIS — Z1211 Encounter for screening for malignant neoplasm of colon: Secondary | ICD-10-CM

## 2024-01-03 DIAGNOSIS — Z800 Family history of malignant neoplasm of digestive organs: Secondary | ICD-10-CM | POA: Diagnosis not present

## 2024-01-03 MED ORDER — SODIUM CHLORIDE 0.9 % IV SOLN: 500.0000 mL | Freq: Once | INTRAVENOUS | Status: DC

## 2024-01-03 NOTE — Progress Notes (Signed)
Called to room to assist during endoscopic procedure.  Patient ID and intended procedure confirmed with present staff. Received instructions for my participation in the procedure from the performing physician.

## 2024-01-03 NOTE — Progress Notes (Signed)
[facility] History and Physical   Primary Care Physician:  Ardith Dark, MD   Reason for Procedure:   Colon cancer screening/family history of polyps  Plan:    Screening colonoscopy     HPI: James Jimenez is a 57 y.o. male with a family history of colon polyps and suspected metastatic colon cancer, with 1 previous colonoscopy in 2018 with 3 small hyperplastic polyps. Although the details of his sisters polyps are not known, he does know one of his sisters was recommended to repeat colonoscopy in 3 years. This likely indicates that she either had numerous polyps or had an advanced polyp.  His mother had metastatic cancer, suspected to be colonic in etiology.  He has no chronic GI symptoms.   Past Medical History:  Diagnosis Date   Allergy    Asthma    reactive airway - uses inhaler    Hyperlipidemia    Hypertension    Left inguinal hernia     Past Surgical History:  Procedure Laterality Date   CHOLECYSTECTOMY N/A 12/09/2020   Procedure: LAPAROSCOPIC CHOLECYSTECTOMY;  Surgeon: Quentin Ore, MD;  Location: [facility];  Service: General;  Laterality: N/A;   HERNIA REPAIR  1995 1991    Prior to Admission medications   Medication Sig Start Date End Date Taking? Authorizing Provider  amLODipine (NORVASC) 10 MG tablet Take 1 tablet (10 mg total) by mouth daily. 06/27/23  Yes Ardith Dark, MD  albuterol (VENTOLIN HFA) 108 (90 Base) MCG/ACT inhaler Inhale 2 puffs into the lungs every 6 (six) hours as needed for wheezing or shortness of breath. 11/18/22   Ardith Dark, MD    Current Outpatient Medications  Medication Sig Dispense Refill   amLODipine (NORVASC) 10 MG tablet Take 1 tablet (10 mg total) by mouth daily. 90 tablet 3   albuterol (VENTOLIN HFA) 108 (90 Base) MCG/ACT inhaler Inhale 2 puffs into the lungs every 6 (six) hours as needed for wheezing or shortness of breath. 8 g 0   Current Facility-Administered Medications  Medication Dose Route  Frequency Provider Last Rate Last Admin   0.9 %  sodium chloride infusion  500 mL Intravenous Once Jenel Lucks, MD        Allergies as of 01/03/2024   (No Known Allergies)    Family History  Problem Relation Age of Onset   Hypertension Mother    Arthritis Mother    Gastric cancer Mother    Hyperlipidemia Father    Prostate cancer Father    Hypertension Sister    Gallbladder disease Sister    Colon polyps Sister    Colon polyps Sister    Gallbladder disease Sister    Arthritis Maternal Grandmother    Hypertension Maternal Grandmother    Kidney disease Maternal Grandmother    Esophageal cancer Maternal Grandfather    Epilepsy Maternal Grandfather    Neuropathy Paternal Grandfather    Colon cancer Neg Hx    Pancreatic cancer Neg Hx    Rectal cancer Neg Hx    Stomach cancer Neg Hx     Social History   Socioeconomic History   Marital status: Married    Spouse name: Not on file   Number of children: Not on file   Years of education: Not on file   Highest education level: Not on file  Occupational History   Not on file  Tobacco Use   Smoking status: Never   Smokeless tobacco: Never  Vaping Use   Vaping status:  Never Used  Substance and Sexual Activity   Alcohol use: Never   Drug use: Never   Sexual activity: Not on file  Other Topics Concern   Not on file  Social History Narrative   Not on file   Social Drivers of Health   Financial Resource Strain: Not on file  Food Insecurity: Not on file  Transportation Needs: No Transportation Needs (06/27/2023)   PRAPARE - Transportation    Lack of Transportation (Medical): No    Lack of Transportation (Non-Medical): No  Physical Activity: Not on file  Stress: Not on file  Social Connections: Not on file  Intimate Partner Violence: Not on file    Review of Systems:  All other review of systems negative except as mentioned in the HPI.  Physical Exam: Vital signs BP 135/74 (BP Location: Left Arm, Patient  Position: Sitting)   Pulse 67   Temp 98.4 F (36.9 C) (Temporal)   Ht 6\' 1"  (1.854 m)   Wt 230 lb (104.3 kg)   SpO2 98%   BMI 30.34 kg/m   General:   Alert,  Well-developed, well-nourished, pleasant and cooperative in NAD Airway:  Mallampati 2 Lungs:  Clear throughout to auscultation.   Heart:  Regular rate and rhythm; no murmurs, clicks, rubs,  or gallops. Abdomen:  Soft, nontender and nondistended. Normal bowel sounds.   Neuro/Psych:  Normal mood and affect. A and O x 3   Shayon Trompeter E. Tomasa Rand, MD [facility]

## 2024-01-03 NOTE — Progress Notes (Signed)
Drowsy, VSS, resps reg and even. Report to RN

## 2024-01-03 NOTE — Op Note (Signed)
[facility] Patient Name: James Jimenez Procedure Date: 01/03/2024 8:29 AM MRN: [id number] Endoscopist: Lorin Picket E. Tomasa Rand , MD, [id number] Age: 58 Referring MD:  Date of Birth: December 15, 1965 Gender: Male Account #: [id number] Procedure:                Colonoscopy Indications:              Colon cancer screening in patient at increased                            risk: Family history of 1st-degree relative with                            colon polyps before age 28 years Medicines:                Monitored Anesthesia Care Procedure:                Pre-Anesthesia Assessment:                           - Prior to the procedure, a History and Physical                            was performed, and patient medications and                            allergies were reviewed. The patient's tolerance of                            previous anesthesia was also reviewed. The risks                            and benefits of the procedure and the sedation                            options and risks were discussed with the patient.                            All questions were answered, and informed consent                            was obtained. Prior Anticoagulants: The patient has                            taken no anticoagulant or antiplatelet agents. ASA                            Grade Assessment: II - A patient with mild systemic                            disease. After reviewing the risks and benefits,                            the patient was deemed in satisfactory condition to  undergo the procedure.                           After obtaining informed consent, the colonoscope                            was passed under direct vision. Throughout the                            procedure, the patient's blood pressure, pulse, and                            oxygen saturations were monitored continuously. The                            Olympus Scope SN:  [id number] was introduced through                            the anus and advanced to the the cecum, identified                            by appendiceal orifice and ileocecal valve. The                            colonoscopy was performed without difficulty. The                            patient tolerated the procedure well. The quality                            of the bowel preparation was adequate. The                            ileocecal valve, appendiceal orifice, and rectum                            were photographed. The bowel preparation used was                            SUPREP via split dose instruction. Scope In: 8:39:08 AM Scope Out: 8:57:41 AM Scope Withdrawal Time: 0 hours 14 minutes 4 seconds  Total Procedure Duration: 0 hours 18 minutes 33 seconds  Findings:                 The perianal and digital rectal examinations were                            normal. Pertinent negatives include normal                            sphincter tone and no palpable rectal lesions.                           Two sessile polyps were found in the cecum. The  polyps were 2 mm in size. These polyps were removed                            with a cold snare. Resection and retrieval were                            complete. Estimated blood loss was minimal.                           Multiple medium-mouthed and small-mouthed                            diverticula were found in the sigmoid colon and                            descending colon.                           The exam was otherwise normal throughout the                            examined colon.                           The retroflexed view of the distal rectum and anal                            verge was normal and showed no anal or rectal                            abnormalities. Complications:            No immediate complications. Estimated Blood Loss:     Estimated blood loss was minimal. Impression:                - Two 2 mm polyps in the cecum, removed with a cold                            snare. Resected and retrieved.                           - Moderate diverticulosis in the sigmoid colon and                            in the descending colon.                           - The distal rectum and anal verge are normal on                            retroflexion view. Recommendation:           - Patient has a contact number available for                            emergencies. The signs and symptoms  of potential                            delayed complications were discussed with the                            patient. Return to normal activities tomorrow.                            Written discharge instructions were provided to the                            patient.                           - Resume previous diet.                           - Continue present medications.                           - Await pathology results.                           - Repeat colonoscopy (date not yet determined) for                            surveillance based on pathology results. Sarahgrace Broman E. Tomasa Rand, MD 01/03/2024 9:07:20 AM This report has been signed electronically.

## 2024-01-03 NOTE — Patient Instructions (Signed)
YOU HAD AN ENDOSCOPIC PROCEDURE TODAY: Refer to the procedure report and other information in the discharge instructions given to you for any specific questions about what was found during the examination. If this information does not answer your questions, please call [place] office at [phone] to clarify.   YOU SHOULD EXPECT: Some feelings of bloating in the abdomen. Passage of more gas than usual. Walking can help get rid of the air that was put into your GI tract during the procedure and reduce the bloating. If you had a lower endoscopy (such as a colonoscopy or flexible sigmoidoscopy) you may notice spotting of blood in your stool or on the toilet paper. Some abdominal soreness may be present for a day or two, also.  DIET: Your first meal following the procedure should be a light meal and then it is ok to progress to your normal diet. A half-sandwich or bowl of soup is an example of a good first meal. Heavy or fried foods are harder to digest and may make you feel nauseous or bloated. Drink plenty of fluids but you should avoid alcoholic beverages for 24 hours. If you had a esophageal dilation, please see attached instructions for diet.    ACTIVITY: Your care partner should take you home directly after the procedure. You should plan to take it easy, moving slowly for the rest of the day. You can resume normal activity the day after the procedure however YOU SHOULD NOT DRIVE, use power tools, machinery or perform tasks that involve climbing or major physical exertion for 24 hours (because of the sedation medicines used during the test).   SYMPTOMS TO REPORT IMMEDIATELY: A gastroenterologist can be reached at any hour. Please call [phone]  for any of the following symptoms:  Following lower endoscopy (colonoscopy, flexible sigmoidoscopy) Excessive amounts of blood in the stool  Significant tenderness, worsening of abdominal pains  Swelling of the abdomen that is new, acute  Fever of 100 or  higher   FOLLOW UP:  If any biopsies were taken you will be contacted by phone or by letter within the next 1-3 weeks. Call [phone]  if you have not heard about the biopsies in 3 weeks.  Please also call with any specific questions about appointments or follow up tests.

## 2024-01-03 NOTE — Progress Notes (Signed)
Pt's states no medical or surgical changes since previsit or office visit.

## 2024-01-04 ENCOUNTER — Telehealth: Payer: Self-pay | Admitting: *Deleted

## 2024-01-04 NOTE — Telephone Encounter (Signed)
  Follow up Call-     01/03/2024    7:53 AM  Call back number  Post procedure Call Back phone  # [phone]  Permission to leave phone message Yes     Patient questions:  Do you have a fever, pain , or abdominal swelling? No. Pain Score  0 *  Have you tolerated food without any problems? Yes.    Have you been able to return to your normal activities? Yes.    Do you have any questions about your discharge instructions: Diet   No. Medications  No. Follow up visit  No.  Do you have questions or concerns about your Care? No.  Actions: * If pain score is 4 or above: No action needed, pain <4.

## 2024-01-05 LAB — SURGICAL PATHOLOGY

## 2024-01-09 ENCOUNTER — Encounter: Payer: Self-pay | Admitting: Gastroenterology

## 2024-01-09 NOTE — Progress Notes (Signed)
James Jimenez, One polyp which I removed during your recent procedure was proven to be completely benign but is considered a "pre-cancerous" polyp that MAY have grown into cancer if it had not been removed.  Studies shows that at least 20% of women over age 60 and 30% of men over age 54 have pre-cancerous polyps.  Based on current nationally recognized surveillance guidelines, I recommend that you have a repeat colonoscopy in 7 years.   If you develop any new rectal bleeding, abdominal pain or significant bowel habit changes, please contact me before then.

## 2024-02-26 ENCOUNTER — Other Ambulatory Visit: Payer: 59

## 2024-02-29 ENCOUNTER — Encounter: Payer: Self-pay | Admitting: Urology

## 2024-03-05 ENCOUNTER — Encounter (HOSPITAL_BASED_OUTPATIENT_CLINIC_OR_DEPARTMENT_OTHER): Payer: Self-pay | Admitting: Emergency Medicine

## 2024-03-05 ENCOUNTER — Emergency Department (HOSPITAL_BASED_OUTPATIENT_CLINIC_OR_DEPARTMENT_OTHER)
Admission: EM | Admit: 2024-03-05 | Discharge: 2024-03-05 | Disposition: A | Discharge status: Home / Self Care | Attending: Emergency Medicine | Admitting: Emergency Medicine

## 2024-03-05 ENCOUNTER — Emergency Department (HOSPITAL_BASED_OUTPATIENT_CLINIC_OR_DEPARTMENT_OTHER)

## 2024-03-05 ENCOUNTER — Encounter: Payer: Self-pay | Admitting: Urology

## 2024-03-05 ENCOUNTER — Other Ambulatory Visit: Payer: Self-pay

## 2024-03-05 DIAGNOSIS — S51811A Laceration without foreign body of right forearm, initial encounter: Secondary | ICD-10-CM | POA: Insufficient documentation

## 2024-03-05 DIAGNOSIS — W134XXA Fall from, out of or through window, initial encounter: Secondary | ICD-10-CM | POA: Insufficient documentation

## 2024-03-05 MED ORDER — BACITRACIN ZINC 500 UNIT/GM EX OINT
TOPICAL_OINTMENT | Freq: Once | CUTANEOUS | Status: AC
  Administered 2024-03-05: 31.5 via TOPICAL
  Filled 2024-03-05: qty 28.35

## 2024-03-05 MED ORDER — LIDOCAINE HCL (PF) 1 % IJ SOLN
15.0000 mL | Freq: Once | INTRAMUSCULAR | Status: AC
  Administered 2024-03-05: 15 mL
  Filled 2024-03-05: qty 15

## 2024-03-05 MED ORDER — IBUPROFEN 400 MG PO TABS
600.0000 mg | ORAL_TABLET | Freq: Once | ORAL | Status: AC
  Administered 2024-03-05: 600 mg via ORAL
  Filled 2024-03-05: qty 1

## 2024-03-05 NOTE — ED Provider Notes (Signed)
 [place] EMERGENCY DEPARTMENT AT [facility] Provider Note   CSN: [id number] Arrival date & time: 03/05/24  1653     History  Chief Complaint  Patient presents with   Extremity Laceration    James Jimenez is a 58 y.o. male who presents with concern for a laceration of the right forearm that occurred just prior to arrival.  States he fell through a glass window, sustaining the cuts.  Denies hitting his head or any loss of consciousness.  Reports his tetanus was updated in 2017.  HPI     Home Medications Prior to Admission medications   Medication Sig Start Date End Date Taking? Authorizing Provider  albuterol (VENTOLIN HFA) 108 (90 Base) MCG/ACT inhaler Inhale 2 puffs into the lungs every 6 (six) hours as needed for wheezing or shortness of breath. 11/18/22   Ardith Dark, MD  amLODipine (NORVASC) 10 MG tablet Take 1 tablet (10 mg total) by mouth daily. 06/27/23   Ardith Dark, MD      Allergies    Patient has no known allergies.    Review of Systems   Review of Systems  Skin:  Positive for wound.    Physical Exam Updated Vital Signs BP (!) 145/92   Pulse 66   Temp 98.9 F (37.2 C)   Resp 16   SpO2 97%  Physical Exam Vitals and nursing note reviewed.  Constitutional:      Appearance: Normal appearance.  HENT:     Head: Normocephalic and atraumatic.  Cardiovascular:     Comments: 2+ bilaterally Pulmonary:     Effort: Pulmonary effort is normal.  Musculoskeletal:     Comments: Able to fully flex and extend at right wrist and elbow No return patient over the radius or ulna, carpal bones, or hand diffusely  Skin:    Comments: 4.5 cm laceration to the anterior right forearm involving the adipose tissue.  No visualization of bone or muscle.  No obvious foreign body.  3.5 cm laceration to the posterior right forearm, no visualization of bone or muscle.  No obvious foreign body  Neurological:     General: No focal deficit present.     Mental  Status: He is alert.     Comments: Intact sensation in the right upper extremity  Psychiatric:        Mood and Affect: Mood normal.        Behavior: Behavior normal.         ED Results / Procedures / Treatments   Labs (all labs ordered are listed, but only abnormal results are displayed) Labs Reviewed - No data to display  EKG None  Radiology DG Forearm Right Result Date: 03/05/2024 CLINICAL DATA:  Laceration.  Fell through glass window EXAM: RIGHT FOREARM - 2 VIEW COMPARISON:  None Available. FINDINGS: There is no evidence of fracture or other focal bone lesions. Soft tissues are unremarkable. No definite radiopaque foreign body identified. Please correlate for the exact location of the laceration. IMPRESSION: No acute osseous abnormality. No radiopaque foreign body seen. Please correlate for exact location of the laceration. Electronically Signed   By: Karen Kays M.D.   On: 03/05/2024 18:58    Procedures .Laceration Repair  Date/Time: 03/05/2024 9:27 PM  Performed by: Arabella Merles, PA-C Authorized by: Arabella Merles, PA-C   Consent:    Consent obtained:  Verbal   Consent given by:  Patient   Risks, benefits, and alternatives were discussed: yes     Risks discussed:  Infection, pain and poor cosmetic result   Alternatives discussed:  No treatment Universal protocol:    Procedure explained and questions answered to patient or proxy's satisfaction: yes     Patient identity confirmed:  Verbally with patient Anesthesia:    Anesthesia method:  Local infiltration   Local anesthetic:  Lidocaine 1% w/o epi (7ml) Laceration details:    Location: Right posterior forearm.   Length (cm):  3.5   Depth (mm):  2 Pre-procedure details:    Preparation:  Patient was prepped and draped in usual sterile fashion and imaging obtained to evaluate for foreign bodies Exploration:    Imaging obtained: x-ray     Imaging outcome: foreign body not noted     Wound exploration: wound  explored through full range of motion and entire depth of wound visualized     Wound extent: no signs of injury, no nerve damage and no tendon damage     Contaminated: no   Treatment:    Area cleansed with:  Chlorhexidine   Irrigation solution:  Sterile saline   Irrigation method:  Syringe   Debridement:  Minimal Skin repair:    Repair method:  Sutures   Suture size:  5-0   Suture material:  Prolene   Suture technique:  Simple interrupted   Number of sutures:  9 Repair type:    Repair type:  Simple Post-procedure details:    Dressing:  Antibiotic ointment and sterile dressing   Procedure completion:  Tolerated well, no immediate complications .Laceration Repair  Date/Time: 03/05/2024 9:29 PM  Performed by: Arabella Merles, PA-C Authorized by: Arabella Merles, PA-C   Consent:    Consent obtained:  Verbal   Consent given by:  Patient   Risks, benefits, and alternatives were discussed: yes     Risks discussed:  Infection, pain, poor cosmetic result and nerve damage   Alternatives discussed:  No treatment Universal protocol:    Procedure explained and questions answered to patient or proxy's satisfaction: yes     Test results available: yes     Patient identity confirmed:  Verbally with patient Anesthesia:    Anesthesia method:  Local infiltration   Local anesthetic:  Lidocaine 1% w/o epi (6ml) Laceration details:    Location: Right anterior forearm.   Length (cm):  4.5   Depth (mm):  4 Pre-procedure details:    Preparation:  Patient was prepped and draped in usual sterile fashion and imaging obtained to evaluate for foreign bodies Exploration:    Imaging obtained: x-ray     Imaging outcome: foreign body not noted     Wound extent: no foreign body, no nerve damage and no tendon damage   Treatment:    Area cleansed with:  Chlorhexidine   Irrigation solution:  Sterile saline   Irrigation method:  Syringe   Debridement:  Minimal Skin repair:    Repair method:   Sutures   Suture size:  5-0   Suture material:  Prolene   Suture technique:  Simple interrupted   Number of sutures:  11 Repair type:    Repair type:  Simple Post-procedure details:    Dressing:  Antibiotic ointment and sterile dressing   Procedure completion:  Tolerated well, no immediate complications     Medications Ordered in ED Medications  ibuprofen (ADVIL) tablet 600 mg (has no administration in time range)  lidocaine (PF) (XYLOCAINE) 1 % injection 15 mL (15 mLs Infiltration Given 03/05/24 2116)  bacitracin ointment (31.5 Applications Topical Given 03/05/24 2116)  ED Course/ Medical Decision Making/ A&P                                 Medical Decision Making Amount and/or Complexity of Data Reviewed Radiology: ordered.  Risk OTC drugs. Prescription drug management.     Differential diagnosis includes but is not limited to laceration, tendon or nerve injury, retained foreign body, fracture  ED Course:  Patient well-appearing, stable vital signs.  He sustained 2 lacerations to the right forearm when falling through a glass window.  No bone or muscle visualized, only involves the skin and adipose tissue.  No foreign body noted on exam, but x-ray was obtained for further evaluation.  This was negative for any acute fracture, dislocation, or foreign body.  Lacerations were irrigated well with sterile saline and closed with simple interrupted sutures.  Patient remains neurovascular intact before and after suture placement.  Given ibuprofen for pain.  His tetanus has been updated within the last 10 years, no indication for update today.  Stable and appropriate for discharge home   Imaging Studies ordered: I ordered imaging studies including x-ray right forearm I independently visualized the imaging with scope of interpretation limited to determining acute life threatening conditions related to emergency care. Imaging showed no fractures, dislocations, or foreign body I  agree with the radiologist interpretation  Impression: Lacerations to right forearm  Disposition:  The patient was discharged home with instructions to keep lacerations clean with soap and water.  Apply antibiotic ointment over the laceration sites as shown here.  Cover with sterile gauze as shown here.  Follow-up with PCP, or return back to urgent care or ER for suture removal in 7 to 10 days.  Tylenol and ibuprofen as needed for pain. Return precautions given.    This chart was dictated using voice recognition software, Dragon. Despite the best efforts of this provider to proofread and correct errors, errors may still occur which can change documentation meaning.          Final Clinical Impression(s) / ED Diagnoses Final diagnoses:  Laceration of right forearm, initial encounter    Rx / DC Orders ED Discharge Orders     None         Arabella Merles, Cordelia Poche 03/05/24 2135    Rondel Baton, MD 03/06/24 [phone]

## 2024-03-05 NOTE — ED Notes (Signed)
 ED Provider at bedside.

## 2024-03-05 NOTE — Discharge Instructions (Signed)
 You had 9 non-absorbable sutures placed in the first laceration, and 11 non-absorbable sutures placed in your other laceration.  You must get your sutures rechecked for possible removal in 7-10 days. We recommend visiting your PCP or an urgent care for suture removal. However, you may also return back to the ER if you are unable to be seen by your PCP or at urgent care.   You may gently clean the area around your laceration as needed with soap and water. Place antibiotic ointment such as bacitracin or neosporin over your laceration after cleaning the area.  Keep the laceration covered with sterile gauze as shown here if you are doing an activity in which it may get dirty. You may pick these supplies up at any drugstore.  Do not submerge your laceration in water (no baths, swimming) until it is fully healed. You may shower.   You may take up to 1000mg  of tylenol every 6 hours as needed for pain.  Do not take more then 4g per day.  You may use up to 600mg  ibuprofen every 6 hours as needed for pain.  Do not exceed 2.4g of ibuprofen per day.  You were given your first dose here today  Return to the ER should you develop fever, chills, pus drainage from your wound, redness around your wound.

## 2024-03-05 NOTE — ED Triage Notes (Signed)
 Fell through glass window. Lac to right forearm Tetanus in the last 10 years

## 2024-03-14 ENCOUNTER — Ambulatory Visit: Admitting: Family Medicine

## 2024-03-14 VITALS — BP 128/78 | HR 67 | Temp 97.5°F | Ht 73.0 in | Wt 239.0 lb

## 2024-03-14 DIAGNOSIS — M25521 Pain in right elbow: Secondary | ICD-10-CM | POA: Diagnosis not present

## 2024-03-14 DIAGNOSIS — S51811D Laceration without foreign body of right forearm, subsequent encounter: Secondary | ICD-10-CM

## 2024-03-14 DIAGNOSIS — M542 Cervicalgia: Secondary | ICD-10-CM | POA: Diagnosis not present

## 2024-03-14 DIAGNOSIS — I10 Essential (primary) hypertension: Secondary | ICD-10-CM | POA: Diagnosis not present

## 2024-03-14 MED ORDER — MELOXICAM 15 MG PO TABS: 15.0000 mg | ORAL_TABLET | Freq: Every day | ORAL | 0 refills | Status: DC

## 2024-03-14 NOTE — Patient Instructions (Signed)
 It was very nice to see you today!  We removed your stitches today.  The Steri-Strips should all fall off within the next few days.  Please let us know if you have any issues with this.  I think you probably have golfers elbow.  Please start the meloxicam.  Work on the exercises.  Use the elbow strap and use ice as needed.  Let us know if not improving in the next few weeks.  Return if symptoms worsen or fail to improve.   Take care, Dr [name]  PLEASE NOTE:  If you had any lab tests, please let us know if you have not heard back within a few days. You may see your results on mychart before we have a chance to review them but we will give you a call once they are reviewed by [place].   If we ordered any referrals today, please let us know if you have not heard from their office within the next week.   If you had any urgent prescriptions sent in today, please check with the pharmacy within an hour of our visit to make sure the prescription was transmitted appropriately.   Please try these tips to maintain a healthy lifestyle:  Eat at least 3 REAL meals and 1-2 snacks per day.  Aim for no more than 5 hours between eating.  If you eat breakfast, please do so within one hour of getting up.   Each meal should contain half fruits/vegetables, one quarter protein, and one quarter carbs (no bigger than a computer mouse)  Cut down on sweet beverages. This includes juice, soda, and sweet tea.   Drink at least 1 glass of water with each meal and aim for at least 8 glasses per day  Exercise at least 150 minutes every week.

## 2024-03-14 NOTE — Progress Notes (Signed)
   James Jimenez is a 58 y.o. male who presents today for an office visit.  Assessment/Plan:  New/Acute Problems: Forearm Laceration  Sutures removed today.  He tolerated well.  We discussed wound care and reasons to return to care.  He will follow-up as needed.  Right elbow pain Exam and history consistent with medial epicondylitis.  We discussed home exercise program and handout was given.  Also discussed counterforce bracing.  He can use ice as needed.  Will start meloxicam 50 mg daily for the next 1 to 2 weeks.  He will let us know if not improving and we can refer to sports medicine.  Chronic Problems Addressed Today: Neck pain This has been an ongoing issue for many years.  Likely has underlying osteoarthritis.  He did see chiropractor for this in the past however they told him there was nothing they could do.  He should have some improvement with above meloxicam.  We did discuss referral to PT or sports medicine however he would like to hold off on this for now.  He will follow-up with [place] in a few weeks and we can further as needed.  Essential hypertension At goal today on amlodipine 10 mg daily.     Subjective:  HPI:  Patient here for ED follow-up.  Was in the ED 10 days ago with 2 lacerations of the right forearm after falling through glass.  Had 20 total simple interrupted sutures placed for both lacerations.  He has done well the last week.  He is keeping the area clean and dry.  He is also having ongoing right elbow pain for the last 6 months or so.  This preceded his above injury.  He has not had any other obvious injuries or presenting events.  No treatments tried.  He has been working on remodeling a home and thinks he may have overused it.        Objective:  Physical Exam: BP 128/78   Pulse 67   Temp (!) 97.5 F (36.4 C) (Temporal)   Ht 6\' 1"  (1.854 m)   Wt 239 lb (108.4 kg)   SpO2 97%   BMI 31.53 kg/m   Gen: No acute distress, resting comfortably CV: Regular  rate and rhythm with no murmurs appreciated Pulm: Normal work of breathing, clear to auscultation bilaterally with no crackles, wheezes, or rhonchi Skin: 2 separate approximately 3 cm laceration on medial aspect of right forearm.  Well-approximated.  Mild amount of surrounding erythema though no drainage or purulence.  MUSCULOSKELETAL: Right elbow medial epicondyle tender to palpation.  Pain worsen with flexion of 3rd and 4th digits.  Worsened with resisted pronation. Neuro: Grossly normal, moves all extremities Psych: Normal affect and thought content  Procedure Note: Verbal consent obtained.  The above simple interrupted sutures were removed without complication.  He tolerated well.  No bleeding.  Steri-Strips were placed to give extra support.       James Jimenez. James Ralph, MD 03/14/2024 11:54 AM

## 2024-03-14 NOTE — Assessment & Plan Note (Signed)
 This has been an ongoing issue for many years.  Likely has underlying osteoarthritis.  He did see chiropractor for this in the past however they told him there was nothing they could do.  He should have some improvement with above meloxicam.  We did discuss referral to PT or sports medicine however he would like to hold off on this for now.  He will follow-up with [place] in a few weeks and we can further as needed.

## 2024-03-14 NOTE — Assessment & Plan Note (Signed)
At goal today on amlodipine 10 mg daily. 

## 2024-07-18 ENCOUNTER — Other Ambulatory Visit: Payer: Self-pay | Admitting: Family Medicine

## 2024-08-19 ENCOUNTER — Other Ambulatory Visit: Payer: Self-pay | Admitting: Family Medicine

## 2024-09-04 NOTE — Progress Notes (Unsigned)
 Assessment: 1. Elevated PSA   2. Family history of prostate cancer   3. Lower urinary tract symptoms     Plan: PSA today I discussed a trial of medical therapy for his lower urinary tract symptoms. He would like to try saw palmetto to see if this may improve his symptoms.  If he does not see benefit, I advised him to let me know and I will send in a prescription for tamsulosin or alfuzosin. Return to office in 6 months  Chief Complaint:  Chief Complaint  Patient presents with   Elevated PSA    History of Present Illness:  James Jimenez is a 58 y.o. male who is seen for further evaluation of elevated PSA.  PSA results: 10/21 2.88 12/22 4.81 12/23 3.40 12/24 4.31 1/25 4K test: PSA 3.87, low probability of aggressive prostate cancer on biopsy  He has a family history of prostate cancer with his father.  He was treated with brachytherapy.  No history of UTIs or prostatitis.  No prior urologic evaluation. He does report some urinary symptoms including frequency and nocturia x 2.  He does associate the frequency with his fluid intake.  No dysuria or gross hematuria.  He returns today for follow-up.  He reports a slight increase in lower urinary tract symptoms.  He has daytime frequency and nocturia 3-4 times.  No dysuria or gross hematuria. IPSS = 10/3.  Portions of the above documentation were copied from a prior visit for review purposes only.   Past Medical History:  Past Medical History:  Diagnosis Date   Allergy    Asthma    reactive airway - uses inhaler    Hyperlipidemia    Hypertension    Left inguinal hernia     Past Surgical History:  Past Surgical History:  Procedure Laterality Date   CHOLECYSTECTOMY N/A 12/09/2020   Procedure: LAPAROSCOPIC CHOLECYSTECTOMY;  Surgeon: Lyndel Deward PARAS, MD;  Location: [facility];  Service: General;  Laterality: N/A;   HERNIA REPAIR  1995 1991    Allergies:  No Known Allergies  Family History:  Family History   Problem Relation Age of Onset   Hypertension Mother    Arthritis Mother    Gastric cancer Mother    Hyperlipidemia Father    Prostate cancer Father    Hypertension Sister    Gallbladder disease Sister    Colon polyps Sister    Colon polyps Sister    Gallbladder disease Sister    Arthritis Maternal Grandmother    Hypertension Maternal Grandmother    Kidney disease Maternal Grandmother    Esophageal cancer Maternal Grandfather    Epilepsy Maternal Grandfather    Neuropathy Paternal Grandfather    Colon cancer Neg Hx    Pancreatic cancer Neg Hx    Rectal cancer Neg Hx    Stomach cancer Neg Hx     Social History:  Social History   Tobacco Use   Smoking status: Never   Smokeless tobacco: Never  Vaping Use   Vaping status: Never Used  Substance Use Topics   Alcohol use: Never   Drug use: Never    ROS: Constitutional:  Negative for fever, chills, weight loss CV: Negative for chest pain, previous MI, hypertension Respiratory:  Negative for shortness of breath, wheezing, sleep apnea, frequent cough GI:  Negative for nausea, vomiting, bloody stool, GERD  Physical exam: BP (!) 165/96   Pulse (!) 59   Ht 6' 1 (1.854 m)   Wt 235 lb (106.6 kg)  BMI 31.00 kg/m  GENERAL APPEARANCE:  Well appearing, well developed, well nourished, NAD HEENT:  Atraumatic, normocephalic, oropharynx clear NECK:  Supple without lymphadenopathy or thyromegaly ABDOMEN:  Soft, non-tender, no masses EXTREMITIES:  Moves all extremities well, without clubbing, cyanosis, or edema NEUROLOGIC:  Alert and oriented x 3, normal gait, CN II-XII grossly intact MENTAL STATUS:  appropriate BACK:  Non-tender to palpation, No CVAT SKIN:  Warm, dry, and intact    Results: U/A: 0-5 WBCs, 0-2 RBCs

## 2024-09-05 ENCOUNTER — Encounter: Payer: Self-pay | Admitting: Urology

## 2024-09-05 ENCOUNTER — Ambulatory Visit: Admitting: Urology

## 2024-09-05 VITALS — BP 165/96 | HR 59 | Ht 73.0 in | Wt 235.0 lb

## 2024-09-05 DIAGNOSIS — R399 Unspecified symptoms and signs involving the genitourinary system: Secondary | ICD-10-CM

## 2024-09-05 DIAGNOSIS — R972 Elevated prostate specific antigen [PSA]: Secondary | ICD-10-CM | POA: Diagnosis not present

## 2024-09-05 DIAGNOSIS — Z8042 Family history of malignant neoplasm of prostate: Secondary | ICD-10-CM | POA: Diagnosis not present

## 2024-09-05 LAB — URINALYSIS, ROUTINE W REFLEX MICROSCOPIC
Bilirubin, UA: NEGATIVE
Glucose, UA: NEGATIVE
Ketones, UA: NEGATIVE
Leukocytes,UA: NEGATIVE
Nitrite, UA: NEGATIVE
RBC, UA: NEGATIVE
Specific Gravity, UA: 1.025 (ref 1.005–1.030)
Urobilinogen, Ur: 0.2 mg/dL (ref 0.2–1.0)
pH, UA: 6 (ref 5.0–7.5)

## 2024-09-05 LAB — MICROSCOPIC EXAMINATION: Epithelial Cells (non renal): NONE SEEN /HPF (ref 0–10)

## 2024-09-06 LAB — PSA: Prostate Specific Ag, Serum: 5.3 ng/mL — ABNORMAL HIGH (ref 0.0–4.0)

## 2024-09-12 ENCOUNTER — Ambulatory Visit: Payer: Self-pay | Admitting: Urology

## 2024-09-12 DIAGNOSIS — R972 Elevated prostate specific antigen [PSA]: Secondary | ICD-10-CM

## 2024-09-13 ENCOUNTER — Telehealth: Payer: Self-pay

## 2024-09-13 NOTE — Telephone Encounter (Signed)
 Patient scheduled, given prep instructions via mychart and over the phone.

## 2024-09-13 NOTE — Telephone Encounter (Signed)
-----   Message from Adine Manly sent at 09/12/2024  5:28 PM EDT ----- Please schedule patient for prostate biopsy.  He needs instructions. Orders entered

## 2024-09-16 ENCOUNTER — Telehealth: Payer: Self-pay | Admitting: Urology

## 2024-09-16 NOTE — Telephone Encounter (Signed)
 LM FOR PATIENT TO CB TO CONFIRM HIS NEW APPT DATE AND TIME MOVED DUE TO PROVIDER BEING IN THE OR 10-13 MB

## 2024-09-16 NOTE — Telephone Encounter (Signed)
 Pt called and confirmed new apt date and time.

## 2024-09-20 ENCOUNTER — Other Ambulatory Visit: Payer: Self-pay | Admitting: Family Medicine

## 2024-09-26 DIAGNOSIS — L821 Other seborrheic keratosis: Secondary | ICD-10-CM | POA: Diagnosis not present

## 2024-09-26 DIAGNOSIS — L820 Inflamed seborrheic keratosis: Secondary | ICD-10-CM | POA: Diagnosis not present

## 2024-09-26 DIAGNOSIS — L538 Other specified erythematous conditions: Secondary | ICD-10-CM | POA: Diagnosis not present

## 2024-09-26 DIAGNOSIS — L578 Other skin changes due to chronic exposure to nonionizing radiation: Secondary | ICD-10-CM | POA: Diagnosis not present

## 2024-09-26 DIAGNOSIS — L814 Other melanin hyperpigmentation: Secondary | ICD-10-CM | POA: Diagnosis not present

## 2024-09-26 DIAGNOSIS — D1801 Hemangioma of skin and subcutaneous tissue: Secondary | ICD-10-CM | POA: Diagnosis not present

## 2024-10-01 ENCOUNTER — Ambulatory Visit (HOSPITAL_BASED_OUTPATIENT_CLINIC_OR_DEPARTMENT_OTHER)

## 2024-10-01 ENCOUNTER — Other Ambulatory Visit: Admitting: Urology

## 2024-10-02 ENCOUNTER — Ambulatory Visit (HOSPITAL_BASED_OUTPATIENT_CLINIC_OR_DEPARTMENT_OTHER)

## 2024-10-02 ENCOUNTER — Other Ambulatory Visit: Admitting: Urology

## 2024-10-18 ENCOUNTER — Other Ambulatory Visit: Payer: Self-pay | Admitting: Family Medicine

## 2024-11-17 ENCOUNTER — Other Ambulatory Visit: Payer: Self-pay | Admitting: Family Medicine

## 2024-11-26 ENCOUNTER — Encounter: Payer: 59 | Admitting: Family Medicine

## 2024-12-20 ENCOUNTER — Other Ambulatory Visit: Payer: Self-pay | Admitting: Family Medicine

## 2025-03-06 ENCOUNTER — Ambulatory Visit: Admitting: Urology
